# Patient Record
Sex: Female | Born: 1965 | Race: White | Hispanic: No | Marital: Single | Smoking: Never smoker
Health system: Southern US, Community
[De-identification: ages and names within clinical notes are randomized; demographics above are authoritative.]

## PROBLEM LIST (undated history)

## (undated) DIAGNOSIS — R011 Cardiac murmur, unspecified: Secondary | ICD-10-CM

## (undated) DIAGNOSIS — Z803 Family history of malignant neoplasm of breast: Secondary | ICD-10-CM

## (undated) DIAGNOSIS — N924 Excessive bleeding in the premenopausal period: Secondary | ICD-10-CM

## (undated) DIAGNOSIS — Z800 Family history of malignant neoplasm of digestive organs: Secondary | ICD-10-CM

## (undated) DIAGNOSIS — D649 Anemia, unspecified: Secondary | ICD-10-CM

## (undated) DIAGNOSIS — R6250 Unspecified lack of expected normal physiological development in childhood: Secondary | ICD-10-CM

## (undated) DIAGNOSIS — K759 Inflammatory liver disease, unspecified: Secondary | ICD-10-CM

## (undated) DIAGNOSIS — E119 Type 2 diabetes mellitus without complications: Secondary | ICD-10-CM

## (undated) DIAGNOSIS — R569 Unspecified convulsions: Secondary | ICD-10-CM

## (undated) HISTORY — DX: Family history of malignant neoplasm of digestive organs: Z80.0

## (undated) HISTORY — PX: EYE SURGERY: SHX253

## (undated) HISTORY — PX: CHOLECYSTECTOMY: SHX55

## (undated) HISTORY — DX: Family history of malignant neoplasm of breast: Z80.3

---

## 2018-08-15 ENCOUNTER — Other Ambulatory Visit: Payer: Self-pay | Admitting: Obstetrics and Gynecology

## 2018-08-19 ENCOUNTER — Ambulatory Visit (HOSPITAL_COMMUNITY)
Admission: RE | Admit: 2018-08-19 | Discharge: 2018-08-19 | Disposition: A | Discharge status: Home / Self Care | Payer: Medicare Other | Source: Ambulatory Visit | Attending: Obstetrics and Gynecology | Admitting: Obstetrics and Gynecology

## 2018-08-19 DIAGNOSIS — D509 Iron deficiency anemia, unspecified: Secondary | ICD-10-CM | POA: Insufficient documentation

## 2018-08-19 MED ORDER — SODIUM CHLORIDE 0.9 % IV SOLN
INTRAVENOUS | Status: DC | PRN
  Administered 2018-08-19: 250 mL via INTRAVENOUS

## 2018-08-19 MED ORDER — SODIUM CHLORIDE 0.9 % IV SOLN
510.0000 mg | Freq: Once | INTRAVENOUS | Status: AC
  Administered 2018-08-19: 510 mg via INTRAVENOUS
  Filled 2018-08-19: qty 17

## 2018-08-19 NOTE — Progress Notes (Signed)
Patient received Feraheme  via PIV as ordered. Observed for at least 30 minutes post infusion.Tolerated well, vitals stable, discharge instructions given, verbalized understanding. Patient alert, oriented and ambulatory at the time of discharge.  

## 2018-08-19 NOTE — Discharge Instructions (Signed)

## 2018-08-26 ENCOUNTER — Ambulatory Visit (HOSPITAL_COMMUNITY)
Admission: RE | Admit: 2018-08-26 | Discharge: 2018-08-26 | Disposition: A | Discharge status: Home / Self Care | Payer: Medicare Other | Source: Ambulatory Visit | Attending: Obstetrics and Gynecology | Admitting: Obstetrics and Gynecology

## 2018-08-26 DIAGNOSIS — D509 Iron deficiency anemia, unspecified: Secondary | ICD-10-CM | POA: Diagnosis not present

## 2018-08-26 MED ORDER — SODIUM CHLORIDE 0.9 % IV SOLN
510.0000 mg | Freq: Once | INTRAVENOUS | Status: AC
  Administered 2018-08-26: 510 mg via INTRAVENOUS
  Filled 2018-08-26: qty 17

## 2018-08-26 MED ORDER — SODIUM CHLORIDE 0.9 % IV SOLN
INTRAVENOUS | Status: DC | PRN
  Administered 2018-08-26: 250 mL via INTRAVENOUS

## 2018-08-26 NOTE — Discharge Instructions (Signed)

## 2018-08-26 NOTE — Progress Notes (Signed)
PATIENT CARE CENTER NOTE  Diagnosis: Iron Deficiency Anemia   Provider: Dr. Garwin Brothers    Procedure: IV Feraheme    Note: Patient received Feraheme infusion. Tolerated well with no adverse reaction. Observed patient for 30 minutes post-infusion. Vital signs stable. Alert, oriented and ambulatory at discharge.

## 2018-09-04 ENCOUNTER — Other Ambulatory Visit: Payer: Self-pay | Admitting: Obstetrics and Gynecology

## 2018-09-13 ENCOUNTER — Other Ambulatory Visit: Payer: Self-pay

## 2018-09-13 ENCOUNTER — Encounter (HOSPITAL_BASED_OUTPATIENT_CLINIC_OR_DEPARTMENT_OTHER): Payer: Self-pay | Admitting: *Deleted

## 2018-09-13 NOTE — Progress Notes (Addendum)
Spoke with patient sister Mariah Mcclure per patient request Npo after midnight, arrive 935 am 09-16-2018 [facility] meds to take sip of water: carbamazepine, rosuvastatin Driver sister Mariah Mcclure cell [phone] Patient developmentally delayed signs own consents per sister Mariah Mcclure Has surgery orders in epic Needs cbc and urine poct and ekg

## 2018-09-16 ENCOUNTER — Encounter (HOSPITAL_BASED_OUTPATIENT_CLINIC_OR_DEPARTMENT_OTHER)
Admission: RE | Disposition: A | Discharge status: Home / Self Care | Payer: Self-pay | Source: Home / Self Care | Attending: Obstetrics and Gynecology

## 2018-09-16 ENCOUNTER — Ambulatory Visit (HOSPITAL_BASED_OUTPATIENT_CLINIC_OR_DEPARTMENT_OTHER): Payer: Medicare Other | Admitting: Anesthesiology

## 2018-09-16 ENCOUNTER — Ambulatory Visit (HOSPITAL_BASED_OUTPATIENT_CLINIC_OR_DEPARTMENT_OTHER)
Admission: RE | Admit: 2018-09-16 | Discharge: 2018-09-16 | Disposition: A | Discharge status: Home / Self Care | Payer: Medicare Other | Attending: Obstetrics and Gynecology | Admitting: Obstetrics and Gynecology

## 2018-09-16 ENCOUNTER — Encounter (HOSPITAL_BASED_OUTPATIENT_CLINIC_OR_DEPARTMENT_OTHER): Payer: Self-pay | Admitting: Anesthesiology

## 2018-09-16 DIAGNOSIS — Z7984 Long term (current) use of oral hypoglycemic drugs: Secondary | ICD-10-CM | POA: Insufficient documentation

## 2018-09-16 DIAGNOSIS — E785 Hyperlipidemia, unspecified: Secondary | ICD-10-CM | POA: Diagnosis not present

## 2018-09-16 DIAGNOSIS — C541 Malignant neoplasm of endometrium: Secondary | ICD-10-CM | POA: Diagnosis not present

## 2018-09-16 DIAGNOSIS — R6250 Unspecified lack of expected normal physiological development in childhood: Secondary | ICD-10-CM | POA: Insufficient documentation

## 2018-09-16 DIAGNOSIS — R569 Unspecified convulsions: Secondary | ICD-10-CM | POA: Diagnosis not present

## 2018-09-16 DIAGNOSIS — Z79899 Other long term (current) drug therapy: Secondary | ICD-10-CM | POA: Diagnosis not present

## 2018-09-16 DIAGNOSIS — N924 Excessive bleeding in the premenopausal period: Secondary | ICD-10-CM | POA: Insufficient documentation

## 2018-09-16 DIAGNOSIS — E119 Type 2 diabetes mellitus without complications: Secondary | ICD-10-CM | POA: Insufficient documentation

## 2018-09-16 HISTORY — DX: Type 2 diabetes mellitus without complications: E11.9

## 2018-09-16 HISTORY — DX: Inflammatory liver disease, unspecified: K75.9

## 2018-09-16 HISTORY — DX: Unspecified lack of expected normal physiological development in childhood: R62.50

## 2018-09-16 HISTORY — DX: Excessive bleeding in the premenopausal period: N92.4

## 2018-09-16 HISTORY — DX: Unspecified convulsions: R56.9

## 2018-09-16 HISTORY — DX: Anemia, unspecified: D64.9

## 2018-09-16 HISTORY — DX: Cardiac murmur, unspecified: R01.1

## 2018-09-16 HISTORY — PX: DILATATION & CURETTAGE/HYSTEROSCOPY WITH MYOSURE: SHX6511

## 2018-09-16 LAB — CBC
HCT: 35.2 % — ABNORMAL LOW (ref 36.0–46.0)
HEMOGLOBIN: 11.1 g/dL — AB (ref 12.0–15.0)
MCH: 29.9 pg (ref 26.0–34.0)
MCHC: 31.5 g/dL (ref 30.0–36.0)
MCV: 94.9 fL (ref 80.0–100.0)
Platelets: 268 10*3/uL (ref 150–400)
RBC: 3.71 MIL/uL — ABNORMAL LOW (ref 3.87–5.11)
RDW: 15.1 % (ref 11.5–15.5)
WBC: 5.9 10*3/uL (ref 4.0–10.5)
nRBC: 0 % (ref 0.0–0.2)

## 2018-09-16 LAB — GLUCOSE, CAPILLARY
GLUCOSE-CAPILLARY: 155 mg/dL — AB (ref 70–99)
Glucose-Capillary: 141 mg/dL — ABNORMAL HIGH (ref 70–99)

## 2018-09-16 SURGERY — DILATATION & CURETTAGE/HYSTEROSCOPY WITH MYOSURE
Anesthesia: General

## 2018-09-16 MED ORDER — FENTANYL CITRATE (PF) 100 MCG/2ML IJ SOLN
25.0000 ug | INTRAMUSCULAR | Status: DC | PRN
  Filled 2018-09-16: qty 1

## 2018-09-16 MED ORDER — FENTANYL CITRATE (PF) 100 MCG/2ML IJ SOLN
INTRAMUSCULAR | Status: AC
  Filled 2018-09-16: qty 2

## 2018-09-16 MED ORDER — KETOROLAC TROMETHAMINE 30 MG/ML IJ SOLN
INTRAMUSCULAR | Status: AC
  Filled 2018-09-16: qty 1

## 2018-09-16 MED ORDER — LACTATED RINGERS IV SOLN
INTRAVENOUS | Status: DC
  Administered 2018-09-16 (×2): via INTRAVENOUS
  Filled 2018-09-16: qty 1000

## 2018-09-16 MED ORDER — PROPOFOL 10 MG/ML IV BOLUS
INTRAVENOUS | Status: DC | PRN
  Administered 2018-09-16: 150 mg via INTRAVENOUS

## 2018-09-16 MED ORDER — MIDAZOLAM HCL 5 MG/5ML IJ SOLN
INTRAMUSCULAR | Status: DC | PRN
  Administered 2018-09-16: 2 mg via INTRAVENOUS

## 2018-09-16 MED ORDER — SODIUM CHLORIDE 0.9 % IR SOLN
Status: DC | PRN
  Administered 2018-09-16: 3000 mL

## 2018-09-16 MED ORDER — DEXAMETHASONE SODIUM PHOSPHATE 10 MG/ML IJ SOLN
INTRAMUSCULAR | Status: DC | PRN
  Administered 2018-09-16: 5 mg via INTRAVENOUS

## 2018-09-16 MED ORDER — ONDANSETRON HCL 4 MG/2ML IJ SOLN
INTRAMUSCULAR | Status: DC | PRN
  Administered 2018-09-16: 4 mg via INTRAVENOUS

## 2018-09-16 MED ORDER — LIDOCAINE 2% (20 MG/ML) 5 ML SYRINGE
INTRAMUSCULAR | Status: DC | PRN
  Administered 2018-09-16: 60 mg via INTRAVENOUS

## 2018-09-16 MED ORDER — OXYCODONE HCL 5 MG PO TABS
5.0000 mg | ORAL_TABLET | Freq: Once | ORAL | Status: DC | PRN
  Filled 2018-09-16: qty 1

## 2018-09-16 MED ORDER — KETOROLAC TROMETHAMINE 30 MG/ML IJ SOLN
INTRAMUSCULAR | Status: DC | PRN
  Administered 2018-09-16: 30 mg via INTRAVENOUS

## 2018-09-16 MED ORDER — MIDAZOLAM HCL 2 MG/2ML IJ SOLN
INTRAMUSCULAR | Status: AC
  Filled 2018-09-16: qty 2

## 2018-09-16 MED ORDER — ONDANSETRON HCL 4 MG/2ML IJ SOLN
4.0000 mg | Freq: Once | INTRAMUSCULAR | Status: DC | PRN
  Filled 2018-09-16: qty 2

## 2018-09-16 MED ORDER — FENTANYL CITRATE (PF) 100 MCG/2ML IJ SOLN
INTRAMUSCULAR | Status: DC | PRN
  Administered 2018-09-16: 50 ug via INTRAVENOUS

## 2018-09-16 MED ORDER — MEPERIDINE HCL 25 MG/ML IJ SOLN
6.2500 mg | INTRAMUSCULAR | Status: DC | PRN
  Filled 2018-09-16: qty 1

## 2018-09-16 MED ORDER — IBUPROFEN 800 MG PO TABS: 800.0000 mg | ORAL_TABLET | Freq: Three times a day (TID) | ORAL | 1 refills | Status: AC | PRN

## 2018-09-16 MED ORDER — OXYCODONE HCL 5 MG/5ML PO SOLN
5.0000 mg | Freq: Once | ORAL | Status: DC | PRN
  Filled 2018-09-16: qty 5

## 2018-09-16 SURGICAL SUPPLY — 26 items
BIPOLAR CUTTING LOOP 21FR (ELECTRODE)
CANISTER SUCT 3000ML PPV (MISCELLANEOUS) ×2 IMPLANT
CATH ROBINSON RED A/P 16FR (CATHETERS) ×2 IMPLANT
COVER BACK TABLE 60X90IN (DRAPES) ×2 IMPLANT
COVER WAND RF STERILE (DRAPES) ×2 IMPLANT
DEVICE MYOSURE LITE (MISCELLANEOUS) IMPLANT
DEVICE MYOSURE REACH (MISCELLANEOUS) ×2 IMPLANT
DILATOR CANAL MILEX (MISCELLANEOUS) IMPLANT
DRAPE SHEET LG 3/4 BI-LAMINATE (DRAPES) ×2 IMPLANT
GAUZE 4X4 16PLY RFD (DISPOSABLE) ×2 IMPLANT
GLOVE BIOGEL PI IND STRL 7.0 (GLOVE) ×1 IMPLANT
GLOVE BIOGEL PI INDICATOR 7.0 (GLOVE) ×1
GLOVE ECLIPSE 6.5 STRL STRAW (GLOVE) ×2 IMPLANT
GOWN STRL REUS W/TWL LRG LVL3 (GOWN DISPOSABLE) ×2 IMPLANT
IV NS IRRIG 3000ML ARTHROMATIC (IV SOLUTION) ×2 IMPLANT
KIT PROCEDURE FLUENT (KITS) ×2 IMPLANT
KIT TURNOVER CYSTO (KITS) ×2 IMPLANT
LOOP CUTTING BIPOLAR 21FR (ELECTRODE) IMPLANT
MYOSURE XL FIBROID (MISCELLANEOUS)
PACK VAGINAL MINOR WOMEN LF (CUSTOM PROCEDURE TRAY) ×2 IMPLANT
PAD OB MATERNITY 4.3X12.25 (PERSONAL CARE ITEMS) ×2 IMPLANT
PAD PREP 24X48 CUFFED NSTRL (MISCELLANEOUS) ×2 IMPLANT
SEAL ROD LENS SCOPE MYOSURE (ABLATOR) ×2 IMPLANT
SYSTEM TISS REMOVAL MYOSURE XL (MISCELLANEOUS) IMPLANT
TOWEL OR 17X26 10 PK STRL BLUE (TOWEL DISPOSABLE) ×2 IMPLANT
WATER STERILE IRR 500ML POUR (IV SOLUTION) ×2 IMPLANT

## 2018-09-16 NOTE — Brief Op Note (Signed)
09/16/2018  12:37 PM  PATIENT:  Mariah Mcclure  53 y.o. female  PRE-OPERATIVE DIAGNOSIS:   Abnormal Perimenopausal Bleeding, Endometrial Mass  POST-OPERATIVE DIAGNOSIS:  Abnormal Perimenopausal Bleeding, Endometrial Mass  PROCEDURE:  Diagnostic hysteroscopy, hysteroscopic resection of endometrial thickening and polyp, dilation and curettage  SURGEON:  Surgeon(s) and Role:    Servando Salina, MD - Primary  PHYSICIAN ASSISTANT:   ASSISTANTS: none   ANESTHESIA:   general Findings; diffuse endometrial thickening with LUS post polyp with abnl vessels, tubal ostia seen after thickening removed. Right anterior fundal trabecular appearance. Nl endocervical canal EBL:  20 mL   BLOOD ADMINISTERED:none  DRAINS: none   LOCAL MEDICATIONS USED:  NONE  SPECIMEN:  Source of Specimen:  emc with polyp  DISPOSITION OF SPECIMEN:  PATHOLOGY  COUNTS:  YES  TOURNIQUET:  * No tourniquets in log *  DICTATION: .Other Dictation: Dictation Number [id number]  PLAN OF CARE: Discharge to home after PACU  PATIENT DISPOSITION:  PACU - hemodynamically stable.   Delay start of Pharmacological VTE agent (>24hrs) due to surgical blood loss or risk of bleeding: no

## 2018-09-16 NOTE — Transfer of Care (Signed)
Immediate Anesthesia Transfer of Care Note  Patient: Mariah Mcclure  Procedure(s) Performed: DILATATION & CURETTAGE/HYSTEROSCOPY WITH MYOSURE (N/A )  Patient Location: PACU  Anesthesia Type:General  Level of Consciousness: drowsy  Airway & Oxygen Therapy: Patient Spontanous Breathing and Patient connected to nasal cannula oxygen  Post-op Assessment: Report given to RN  Post vital signs: Reviewed and stable  Last Vitals:  Vitals Value Taken Time  BP 130/66 09/16/2018 12:42 PM  Temp    Pulse 73 09/16/2018 12:43 PM  Resp 23 09/16/2018 12:43 PM  SpO2 100 % 09/16/2018 12:43 PM  Vitals shown include unvalidated device data.  Last Pain:  Vitals:   09/16/18 0933  TempSrc: Oral         Complications: No apparent anesthesia complications

## 2018-09-16 NOTE — Anesthesia Preprocedure Evaluation (Addendum)
Anesthesia Evaluation  Patient identified by MRN, date of birth, ID band Patient awake    Reviewed: Allergy & Precautions, NPO status , Patient's Chart, lab work & pertinent test results  Airway Mallampati: I  TM Distance: >3 FB Neck ROM: Full    Dental no notable dental hx. (+) Teeth Intact   Pulmonary neg pulmonary ROS,    Pulmonary exam normal breath sounds clear to auscultation       Cardiovascular negative cardio ROS Normal cardiovascular exam+ Valvular Problems/Murmurs  Rhythm:Regular Rate:Normal     Neuro/Psych Seizures -, Well Controlled,  Developmental delayLast Sz 30 years ago    GI/Hepatic negative GI ROS, (+) Hepatitis -, CHx/o Hepatitis C - has been treated   Endo/Other  diabetes, Well Controlled, Type 2, Oral Hypoglycemic AgentsHyperlipidemia  Renal/GU      Musculoskeletal   Abdominal   Peds  Hematology  (+) anemia ,   Anesthesia Other Findings   Reproductive/Obstetrics Menometorrhagia AUB Endometrial mass                            Anesthesia Physical Anesthesia Plan  ASA: II  Anesthesia Plan: General   Post-op Pain Management:    Induction: Intravenous  PONV Risk Score and Plan: 4 or greater and Scopolamine patch - Pre-op, Midazolam, Ondansetron, Dexamethasone and Treatment may vary due to age or medical condition  Airway Management Planned: LMA  Additional Equipment:   Intra-op Plan:   Post-operative Plan: Extubation in OR  Informed Consent: I have reviewed the patients History and Physical, chart, labs and discussed the procedure including the risks, benefits and alternatives for the proposed anesthesia with the patient or authorized representative who has indicated his/her understanding and acceptance.     Dental advisory given  Plan Discussed with: CRNA and Surgeon  Anesthesia Plan Comments:        Anesthesia Quick Evaluation

## 2018-09-16 NOTE — Discharge Instructions (Signed)
CALL  IF TEMP>100.4, NOTHING PER VAGINA X 2 WK, CALL IF SOAKING A MAXI  PAD EVERY HOUR OR MORE FREQUENTLY  DISCHARGE INSTRUCTIONS: D&C The following instructions have been prepared to help you care for yourself upon your return home.   Personal hygiene:  Use sanitary pads for vaginal drainage, not tampons.  Shower the day after your procedure.  NO tub baths, pools or Jacuzzis for 2-3 weeks.  Wipe front to back after using the bathroom.  Activity and limitations:  Do NOT drive or operate any equipment for 24 hours. The effects of anesthesia are still present and drowsiness may result.  Do NOT rest in bed all day.  Walking is encouraged.  Walk up and down stairs slowly.  You may resume your normal activity in one to two days or as indicated by your physician.  Sexual activity: NO intercourse for at least 2 weeks after the procedure, or as indicated by your physician.  Diet: Eat a light meal as desired this evening. You may resume your usual diet tomorrow.  Return to work: You may resume your work activities in one to two days or as indicated by your doctor.  What to expect after your surgery: Expect to have vaginal bleeding/discharge for 2-3 days and spotting for up to 10 days. It is not unusual to have soreness for up to 1-2 weeks. You may have a slight burning sensation when you urinate for the first day. Mild cramps may continue for a couple of days. You may have a regular period in 2-6 weeks.  Call your doctor for any of the following:  Excessive vaginal bleeding, saturating and changing one pad every hour.  Inability to urinate 6 hours after discharge from hospital.  Pain not relieved by pain medication.  Fever of 100.4 F or greater.  Unusual vaginal discharge or odor.    Post Anesthesia Home Care Instructions  Activity: Get plenty of rest for the remainder of the day. A responsible individual must stay with you for 24 hours following the procedure.  For the  next 24 hours, DO NOT: -Drive a car -Paediatric nurse -Drink alcoholic beverages -Take any medication unless instructed by your physician -Make any legal decisions or sign important papers.  Meals: Start with liquid foods such as gelatin or soup. Progress to regular foods as tolerated. Avoid greasy, spicy, heavy foods. If nausea and/or vomiting occur, drink only clear liquids until the nausea and/or vomiting subsides. Call your physician if vomiting continues.  Special Instructions/Symptoms: Your throat may feel dry or sore from the anesthesia or the breathing tube placed in your throat during surgery. If this causes discomfort, gargle with warm salt water. The discomfort should disappear within 24 hours.

## 2018-09-16 NOTE — H&P (Addendum)
Mariah Mcclure is an 53 y.o. female G0 SF presents for dx hysteroscopy, D&C, resection of endometrial mass due to AUB( heavy irregular perimenopausal bleeding). sono hysterogram  Showed a 3.0 cm fundal endometrial mass  Pertinent Gynecological History: Menses: heavy irregular Bleeding: dysfunctional uterine bleeding Contraception: none DES exposure: denies Blood transfusions: none Sexually transmitted diseases: no past history Previous GYN Procedures: none  Last mammogram: normal Date: 2019 Last pap: normal Date:2019 OB History: G0   Menstrual History: Menarche age: n/a Patient's last menstrual period was 09/09/2018.    Past Medical History:  Diagnosis Date  . Abnormal perimenopausal bleeding   . Anemia    due to bleeding  . Developmental delay   . Diabetes mellitus without complication ([place])    type 2  . Heart murmur   . Hepatitis    hep c yrs ago took tx  . Seizure ([place]) 30 yrs ago    Past Surgical History:  Procedure Laterality Date  . CHOLECYSTECTOMY  20 yrs ago    History reviewed. No pertinent family history.  Social History:  reports that she has never smoked. She has never used smokeless tobacco. She reports that she does not drink alcohol or use drugs.  Allergies: No Known Allergies  No medications prior to admission.    Review of Systems  All other systems reviewed and are negative.   Height 5' (1.524 m), weight 59.9 kg, last menstrual period 09/09/2018. Physical Exam  Constitutional: She is oriented to person, place, and time. She appears well-developed and well-nourished.  HENT:  Head: Atraumatic.  Eyes: EOM are normal.  Neck: Neck supple.  Cardiovascular: Regular rhythm.  Genitourinary:    Genitourinary Comments: Vulva nl  Vagina nl Cervix closed Uterus AV Adnexal no palp   Musculoskeletal: Normal range of motion.  Neurological: She is alert and oriented to person, place, and time.  Skin: Skin is warm.    No results found for this or  any previous visit (from the past 24 hour(s)).  No results found.  Assessment/Plan: Abnormal perimenopausal bleeding Endometrial mass P) dx hysteroscopy, D&C, resection of endometrial mass. Risk of surgery includes infection, bleeding, uterine perforation ( 07/998) and its risk,  Injury to surrounding organ structures, thermal injury, fluid overload and its risk/mgmt. All ? answered  Ponce Skillman A Veer Elamin 09/16/2018, 5:16 AM   Addendum. No change since last seen

## 2018-09-16 NOTE — Op Note (Signed)
NAMEVAIL, BASISTA MEDICAL RECORD [id number] ACCOUNT [id number] DATE OF BIRTH:Mar 23, 1966 FACILITY: [facility] LOCATION: [facility] PHYSICIAN:Jeancarlos Marchena A. Nazire Fruth, MD  OPERATIVE REPORT  DATE OF PROCEDURE:  09/16/2018  PREOPERATIVE DIAGNOSES:  Abnormal perimenopausal bleeding, endometrial mass.  PROCEDURE PERFORMED:  Diagnostic hysteroscopy, hysteroscopic resection of endometrial mass and thickening, dilation and curettage.  POSTOPERATIVE DIAGNOSES:  Abnormal perimenopausal bleeding, endometrial mass, endometrial thickening.  ANESTHESIA:  General.  SURGEON:  Servando Salina, MD  ASSISTANT:  None.  DESCRIPTION OF PROCEDURE:  Under adequate general anesthesia, the patient was placed in the dorsal lithotomy position.  She was sterilely prepped and draped in the usual fashion.  The bladder was catheterized, a moderate amount of urine.  Examination under anesthesia revealed an anteverted uterus.  No adnexal masses could be appreciated.  A bivalve speculum was placed in the vagina.  A single-tooth tenaculum was placed on the anterior lip of the cervix.  Atrophic vagina was noted.  Initial attempt at  the cervical dilation was limited, and therefore the bivalve speculum was removed.  A weighted speculum was placed which easily facilitated the cervical os being able to be dilated up to a #19 Pratt dilator.  A diagnostic hysteroscope was introduced  into the uterine cavity.  Diffuse thickening frond-like material was noted throughout with the lower uterine segment endometrial polyp with some irregular blood vessels noted.  Using the Reach resectoscope, the entire cavity was resected, at which time  both tubal ostia could then be seen.  There were some trabecular findings on the right fundal area.  The endocervical canal was without any lesions.  When all endometrial thickening and polypoid lesion was removed, all instruments were then removed from the vagina.  Some bleeding was noted afterwards  which was found in the uterine cavity, not the tenaculum site.  With bivalve massage and emptying of the bladder, the bleeding subsequently abated.  SPECIMEN:  Endometrial curettings with polyp sent to pathology.  ESTIMATED BLOOD LOSS:  About 20 mL.  INTRAOPERATIVE FLUIDS:  1 L.  FLUID DEFICIT:  420 mL.  COMPLICATIONS:  None.  DISPOSITION:  The patient tolerated the procedure well and was transferred to recovery room in stable condition.  LN/NUANCE  D:09/16/2018 T:09/16/2018 JOB:[id number]

## 2018-09-16 NOTE — Anesthesia Procedure Notes (Signed)
Procedure Name: LMA Insertion Date/Time: 09/16/2018 11:52 AM Performed by: Bonney Aid, CRNA Pre-anesthesia Checklist: Patient identified, Emergency Drugs available, Suction available and Patient being monitored Patient Re-evaluated:Patient Re-evaluated prior to induction Oxygen Delivery Method: Circle system utilized Preoxygenation: Pre-oxygenation with 100% oxygen Induction Type: IV induction Ventilation: Mask ventilation without difficulty LMA: LMA inserted LMA Size: 4.0 Number of attempts: 1 Airway Equipment and Method: Bite block Placement Confirmation: positive ETCO2 Tube secured with: Tape Dental Injury: Teeth and Oropharynx as per pre-operative assessment

## 2018-09-16 NOTE — Anesthesia Postprocedure Evaluation (Signed)
Anesthesia Post Note  Patient: [name]  Procedure(s) Performed: DILATATION & CURETTAGE/HYSTEROSCOPY WITH MYOSURE (N/A )     Patient location during evaluation: PACU Anesthesia Type: General Level of consciousness: awake and alert and oriented Pain management: pain level controlled Vital Signs Assessment: post-procedure vital signs reviewed and stable Respiratory status: spontaneous breathing, nonlabored ventilation and respiratory function stable Cardiovascular status: stable and blood pressure returned to baseline Postop Assessment: no apparent nausea or vomiting Anesthetic complications: no    Last Vitals:  Vitals:   09/16/18 1245 09/16/18 1300  BP: 130/66 (!) 160/74  Pulse: 68 79  Resp: 10 10  Temp:    SpO2: 100% 100%    Last Pain:  Vitals:   09/16/18 1300  TempSrc:   PainSc: 1                  Zakye Baby A.

## 2018-09-17 ENCOUNTER — Encounter (HOSPITAL_BASED_OUTPATIENT_CLINIC_OR_DEPARTMENT_OTHER): Payer: Self-pay | Admitting: Obstetrics and Gynecology

## 2018-09-23 ENCOUNTER — Other Ambulatory Visit: Payer: Self-pay | Admitting: Gynecologic Oncology

## 2018-09-23 ENCOUNTER — Encounter: Payer: Self-pay | Admitting: Gynecologic Oncology

## 2018-09-23 ENCOUNTER — Inpatient Hospital Stay: Payer: Medicare Other | Attending: Gynecologic Oncology | Admitting: Gynecologic Oncology

## 2018-09-23 VITALS — BP 152/95 | HR 89 | Temp 98.2°F | Resp 20 | Ht <= 58 in | Wt 138.7 lb

## 2018-09-23 DIAGNOSIS — Z803 Family history of malignant neoplasm of breast: Secondary | ICD-10-CM

## 2018-09-23 DIAGNOSIS — Z8049 Family history of malignant neoplasm of other genital organs: Secondary | ICD-10-CM | POA: Diagnosis not present

## 2018-09-23 DIAGNOSIS — Z800 Family history of malignant neoplasm of digestive organs: Secondary | ICD-10-CM | POA: Diagnosis not present

## 2018-09-23 DIAGNOSIS — C541 Malignant neoplasm of endometrium: Secondary | ICD-10-CM | POA: Insufficient documentation

## 2018-09-23 DIAGNOSIS — D4959 Neoplasm of unspecified behavior of other genitourinary organ: Secondary | ICD-10-CM

## 2018-09-23 DIAGNOSIS — Z801 Family history of malignant neoplasm of trachea, bronchus and lung: Secondary | ICD-10-CM | POA: Insufficient documentation

## 2018-09-23 MED ORDER — OXYCODONE HCL 5 MG PO TABS: 5.0000 mg | ORAL_TABLET | ORAL | 0 refills | Status: DC | PRN

## 2018-09-23 MED ORDER — SENNOSIDES-DOCUSATE SODIUM 8.6-50 MG PO TABS: 2.0000 | ORAL_TABLET | Freq: Every day | ORAL | 1 refills | Status: DC

## 2018-09-23 NOTE — H&P (View-Only) (Signed)
Consult Note: Gyn-Onc  Consult was requested by Dr. Garwin Brothers for the evaluation of Mariah Mcclure 53 y.o. female  CC:  Chief Complaint  Patient presents with  . Endometrial cancer [facility])    Assessment/Plan:  [name]  is a 53 y.o.  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   A detailed discussion was held with the patient and her family with regard to to her endometrial cancer diagnosis. We discussed the standard management options for uterine cancer which includes surgery followed possibly by adjuvant therapy depending on the results of surgery. The options for surgical management include a hysterectomy and removal of the tubes and ovaries possibly with removal of pelvic and para-aortic lymph nodes.If feasible, a minimally invasive approach including a robotic hysterectomy or laparoscopic hysterectomy have benefits including shorter hospital stay, recovery time and better wound healing than with open surgery. The patient has been counseled about these surgical options and the risks of surgery in general including infection, bleeding, damage to surrounding structures (including bowel, bladder, ureters, nerves or vessels), and the postoperative risks of PE/ DVT, and lymphedema. I extensively reviewed the additional risks of robotic hysterectomy including possible need for conversion to open laparotomy.  I discussed positioning during surgery of trendelenberg and risks of minor facial swelling and care we take in preoperative positioning.  After counseling and consideration of her options, she desires to proceed with robotic assisted total hysterectomy with bilateral sapingo-oophorectomy and SLN biopsy.   She will have a preoperative CT scan given her high risk endometrial cancer to evaluate for metastatic disease.   She will be seen by anesthesia for preoperative clearance and discussion of postoperative pain management.  She was given the opportunity to ask  questions, which were answered to her satisfaction, and she is agreement with the above mentioned plan of care.  Due to her significant family history we will schedule her to see genetics postoperatively.    HPI: [name] is a 53 year old P0 who is seen in consultation at the request of Dr. cousins for a grade 3 endometrial cancer.  The patient has possibly transition through menopause approximately 2 years ago, although is unclear if she is postmenopausal or not as she is always had a history of abnormal uterine bleeding.  The patient has MR and developmental delay delay and her primary caregivers are her sister in [place] and her brother in [place].  Her sister reports that she developed left lower quadrant pain around Thanksgiving of 2019.  In December 2019 she began reporting heavy vaginal bleeding which became so persistent in January 2020 that she became anemic and required 2 unit packed cell blood transfusion.  She was then taken to see Dr. cousins for further evaluation and Dr. cousins performed a transvaginal ultrasound scan on August 20, 2018 which revealed a uterus measuring 8.5 x 4.8 x 5.3 cm with an endometrial thickness of 25 mm.  This was followed up by a sonohysterogram which revealed endometrial wall thickening within the fundal portion measuring 3 cm with hypervascularity.  She then underwent a D&C in the operating room on September 16, 2018 this was a diagnostic hysteroscopy, hysteroscopic resection of the endometrial mass and D&C.  The procedure was uncomplicated.  The final pathology revealed a high-grade endometrial carcinoma, with a differential diagnosis including high-grade endometrioid and a mixed endometrioid serous carcinoma.  P53 stain was normal.  P 16 did not show significant overexpression.  The patient has some intellectual impairment from  developmental delay.  She is has a seizure disorder though this included on a singular grand mal seizure approximately 30  years ago.  She takes Tegretol for this.  She has type 2 diabetes mellitus which is controlled with metformin.  Her only prior abdominal surgery was a laparoscopic cholecystectomy.  Her family history is remarkable for a maternal aunt who has a history of endometrial cancer a maternal aunt with breast cancer maternal aunt with pancreatic cancer and a maternal uncle with lung cancer who was a non-smoker but possibly had environmental exposure to coal.  Her father had a diagnosis of prostate cancer.  Current Meds:  Outpatient Encounter Medications as of 09/23/2018  Medication Sig  . carbamazepine (TEGRETOL XR) 400 MG 12 hr tablet Take 400 mg by mouth 2 (two) times daily. Seizure 30 yrs ago  . ibuprofen (ADVIL,MOTRIN) 800 MG tablet Take 1 tablet (800 mg total) by mouth every 8 (eight) hours as needed for moderate pain.  . metFORMIN (GLUCOPHAGE) 500 MG tablet Take by mouth 2 (two) times daily with a meal.  . rosuvastatin (CRESTOR) 20 MG tablet Take 20 mg by mouth daily.   No facility-administered encounter medications on file as of 09/23/2018.     Allergy: No Known Allergies  Social Hx:   Social History   Socioeconomic History  . Marital status: Single    Spouse name: Not on file  . Number of children: Not on file  . Years of education: Not on file  . Highest education level: Not on file  Occupational History  . Not on file  Social Needs  . Financial resource strain: Not on file  . Food insecurity:    Worry: Not on file    Inability: Not on file  . Transportation needs:    Medical: Not on file    Non-medical: Not on file  Tobacco Use  . Smoking status: Never Smoker  . Smokeless tobacco: Never Used  Substance and Sexual Activity  . Alcohol use: Never    Frequency: Never  . Drug use: Never  . Sexual activity: Not on file  Lifestyle  . Physical activity:    Days per week: Not on file    Minutes per session: Not on file  . Stress: Not on file  Relationships  . Social  connections:    Talks on phone: Not on file    Gets together: Not on file    Attends religious service: Not on file    Active member of club or organization: Not on file    Attends meetings of clubs or organizations: Not on file    Relationship status: Not on file  . Intimate partner violence:    Fear of current or ex partner: Not on file    Emotionally abused: Not on file    Physically abused: Not on file    Forced sexual activity: Not on file  Other Topics Concern  . Not on file  Social History Narrative  . Not on file    Past Surgical Hx:  Past Surgical History:  Procedure Laterality Date  . CHOLECYSTECTOMY  20 yrs ago  . DILATATION & CURETTAGE/HYSTEROSCOPY WITH MYOSURE N/A 09/16/2018   Procedure: DILATATION & CURETTAGE/HYSTEROSCOPY WITH MYOSURE;  Surgeon: Servando Salina, MD;  Location: [facility];  Service: Gynecology;  Laterality: N/A;  . EYE SURGERY     Patient was 53 years old.    Past Medical Hx:  Past Medical History:  Diagnosis Date  . Abnormal perimenopausal  bleeding   . Anemia    due to bleeding  . Developmental delay   . Diabetes mellitus without complication ([place])    type 2  . Heart murmur   . Hepatitis     Patient states that she has hep A yrs ago took tx  . Seizure ([place]) 30 yrs ago    Past Gynecological History:  G0 Patient's last menstrual period was 09/09/2018 (approximate).  Family Hx:  Family History  Problem Relation Age of Onset  . Stroke Mother   . Prostate cancer Father   . Breast cancer Maternal Aunt   . Lung cancer Maternal Uncle   . Endometrial cancer Maternal Aunt   . Stroke Maternal Aunt   . Pancreatic cancer Maternal Aunt     Review of Systems:  Constitutional  Feels well,    ENT Normal appearing ears and nares bilaterally Skin/Breast  No rash, sores, jaundice, itching, dryness Cardiovascular  No chest pain, shortness of breath, or edema  Pulmonary  No cough or wheeze.  Gastro Intestinal  No nausea,  vomitting, or diarrhoea. No bright red blood per rectum, no abdominal pain, change in bowel movement, or constipation.  Genito Urinary  No frequency, urgency, dysuria, +postmenopausal bleeding Musculo Skeletal  No myalgia, arthralgia, joint swelling or pain  Neurologic  No weakness, numbness, change in gait,  Psychology  No depression, anxiety, insomnia.   Vitals:  Blood pressure (!) 152/95, pulse 89, temperature 98.2 F (36.8 C), temperature source Oral, resp. rate 20, height '4\' 10"'  (1.473 m), weight 138 lb 11.2 oz (62.9 kg), last menstrual period 09/09/2018, SpO2 100 %.  Physical Exam: WD in NAD Neck  Supple NROM, without any enlargements.  Lymph Node Survey No cervical supraclavicular or inguinal adenopathy Cardiovascular  Pulse normal rate, regularity and rhythm. S1 and S2 normal.  Lungs  Clear to auscultation bilateraly, without wheezes/crackles/rhonchi. Good air movement.  Skin  No rash/lesions/breakdown  Psychiatry  Alert and oriented to person, place, and time  Abdomen  Normoactive bowel sounds, abdomen soft, non-tender and nonobese without evidence of hernia. Back No CVA tenderness Genito Urinary  Vulva/vagina: Normal external female genitalia.   No lesions. No discharge or bleeding.  Bladder/urethra:  No lesions or masses, well supported bladder  Vagina: normal, no lesions, narrow upper vagina  Cervix: Normal appearing, no lesions.  Uterus: Small, mobile, no parametrial involvement or nodularity.  Adnexa: no palpable masses. Rectal  deferred Extremities  No bilateral cyanosis, clubbing or edema.   Thereasa Solo, MD  09/23/2018, 9:44 AM

## 2018-09-23 NOTE — Patient Instructions (Signed)
Preparing for your Surgery  Plan for surgery on October 01, 2018 with Dr. [name] at [facility] will be scheduled for a robotic assisted total hysterectomy, bilateral salpingo-oophorectomy, sentinel lymph node biopsy.   Pre-operative Testing -You will receive a phone call from presurgical testing at [facility] if you have not received a call already to arrange for a pre-operative testing appointment before your surgery.  This appointment normally occurs one to two weeks before your scheduled surgery.   -Bring your insurance card, copy of an advanced directive if applicable, medication list  -At that visit, you will be asked to sign a consent for a possible blood transfusion in case a transfusion becomes necessary during surgery.  The need for a blood transfusion is rare but having consent is a necessary part of your care.     -You should not be taking blood thinners or aspirin at least ten days prior to surgery unless instructed by your surgeon.  -As part of our enhanced surgical recovery pathway, you may be advised to drink a carbohydrate drink the morning of surgery (at least 3 hours before). If you are diabetic, this will be avoided in order to prevent elevated glucose levels.  Day Before Surgery at [place] will be asked to take in a light diet the day before surgery.  Avoid carbonated beverages.  You will be advised to have nothing to eat or drink after midnight the evening before.    Eat a light diet the day before surgery.  Examples including soups, broths, toast, yogurt, mashed potatoes.  Things to avoid include carbonated beverages (fizzy beverages), raw fruits and raw vegetables, or beans.   If your bowels are filled with gas, your surgeon will have difficulty visualizing your pelvic organs which increases your surgical risks.  Your role in recovery Your role is to become active as soon as directed by your doctor, while still giving yourself time to heal.   Rest when you feel tired. You will be asked to do the following in order to speed your recovery:  - Cough and breathe deeply. This helps toclear and expand your lungs and can prevent pneumonia. You may be given a spirometer to practice deep breathing. A staff member will show you how to use the spirometer. - Do mild physical activity. Walking or moving your legs help your circulation and body functions return to normal. A staff member will help you when you try to walk and will provide you with simple exercises. Do not try to get up or walk alone the first time. - Actively manage your pain. Managing your pain lets you move in comfort. We will ask you to rate your pain on a scale of zero to 10. It is your responsibility to tell your doctor or nurse where and how much you hurt so your pain can be treated.  Special Considerations -If you are diabetic, you may be placed on insulin after surgery to have closer control over your blood sugars to promote healing and recovery.  This does not mean that you will be discharged on insulin.  If applicable, your oral antidiabetics will be resumed when you are tolerating a solid diet.  -Your final pathology results from surgery should be available around one week after surgery and the results will be relayed to you when available.  -Dr. Lahoma Crocker is the Surgeon that assists your GYN Oncologist with surgery.  If you stay the night, the next day after your surgery  you will either see Dr. Denman George or Dr. Lahoma Crocker.  -FMLA forms can be faxed to [phone] and please allow 5-7 business days for completion.  Pain Management After Surgery -You have been prescribed your pain medication and bowel regimen medications before surgery so that you can have these available when you are discharged from the hospital. The pain medication is for use ONLY AFTER surgery and a new prescription will not be given.   -Make sure that you have Tylenol and Ibuprofen at home  to use on a regular basis after surgery for pain control. We recommend alternating the medications every hour to six hours since they work differently and are processed in the body differently for pain relief.  -Review the attached handout on narcotic use and their risks and side effects.   Bowel Regimen -You have been prescribed Sennakot-S to take nightly to prevent constipation especially if you are taking the narcotic pain medication intermittently.  It is important to prevent constipation and drink adequate amounts of liquids.  Blood Transfusion Information WHAT IS A BLOOD TRANSFUSION? A transfusion is the replacement of blood or some of its parts. Blood is made up of multiple cells which provide different functions.  Red blood cells carry oxygen and are used for blood loss replacement.  White blood cells fight against infection.  Platelets control bleeding.  Plasma helps clot blood.  Other blood products are available for specialized needs, such as hemophilia or other clotting disorders. BEFORE THE TRANSFUSION  Who gives blood for transfusions?   You may be able to donate blood to be used at a later date on yourself (autologous donation).  Relatives can be asked to donate blood. This is generally not any safer than if you have received blood from a stranger. The same precautions are taken to ensure safety when a relative's blood is donated.  Healthy volunteers who are fully evaluated to make sure their blood is safe. This is blood bank blood. Transfusion therapy is the safest it has ever been in the practice of medicine. Before blood is taken from a donor, a complete history is taken to make sure that person has no history of diseases nor engages in risky social behavior (examples are intravenous drug use or sexual activity with multiple partners). The donor's travel history is screened to minimize risk of transmitting infections, such as malaria. The donated blood is tested for  signs of infectious diseases, such as HIV and hepatitis. The blood is then tested to be sure it is compatible with you in order to minimize the chance of a transfusion reaction. If you or a relative donates blood, this is often done in anticipation of surgery and is not appropriate for emergency situations. It takes many days to process the donated blood. RISKS AND COMPLICATIONS Although transfusion therapy is very safe and saves many lives, the main dangers of transfusion include:   Getting an infectious disease.  Developing a transfusion reaction. This is an allergic reaction to something in the blood you were given. Every precaution is taken to prevent this. The decision to have a blood transfusion has been considered carefully by your caregiver before blood is given. Blood is not given unless the benefits outweigh the risks.

## 2018-09-23 NOTE — Progress Notes (Signed)
Consult Note: Gyn-Onc  Consult was requested by Dr. Garwin Brothers for the evaluation of Mariah Mcclure 53 y.o. female  CC:  Chief Complaint  Patient presents with  . Endometrial cancer [facility])    Assessment/Plan:  [name]  is a 53 y.o.  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   A detailed discussion was held with the patient and her family with regard to to her endometrial cancer diagnosis. We discussed the standard management options for uterine cancer which includes surgery followed possibly by adjuvant therapy depending on the results of surgery. The options for surgical management include a hysterectomy and removal of the tubes and ovaries possibly with removal of pelvic and para-aortic lymph nodes.If feasible, a minimally invasive approach including a robotic hysterectomy or laparoscopic hysterectomy have benefits including shorter hospital stay, recovery time and better wound healing than with open surgery. The patient has been counseled about these surgical options and the risks of surgery in general including infection, bleeding, damage to surrounding structures (including bowel, bladder, ureters, nerves or vessels), and the postoperative risks of PE/ DVT, and lymphedema. I extensively reviewed the additional risks of robotic hysterectomy including possible need for conversion to open laparotomy.  I discussed positioning during surgery of trendelenberg and risks of minor facial swelling and care we take in preoperative positioning.  After counseling and consideration of her options, she desires to proceed with robotic assisted total hysterectomy with bilateral sapingo-oophorectomy and SLN biopsy.   She will have a preoperative CT scan given her high risk endometrial cancer to evaluate for metastatic disease.   She will be seen by anesthesia for preoperative clearance and discussion of postoperative pain management.  She was given the opportunity to ask  questions, which were answered to her satisfaction, and she is agreement with the above mentioned plan of care.  Due to her significant family history we will schedule her to see genetics postoperatively.    HPI: [name] is a 53 year old P0 who is seen in consultation at the request of Dr. cousins for a grade 3 endometrial cancer.  The patient has possibly transition through menopause approximately 2 years ago, although is unclear if she is postmenopausal or not as she is always had a history of abnormal uterine bleeding.  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  Her sister reports that she developed left lower quadrant pain around Thanksgiving of 2019.  In December 2019 she began reporting heavy vaginal bleeding which became so persistent in January 2020 that she became anemic and required 2 unit packed cell blood transfusion.  She was then taken to see Dr. cousins for further evaluation and Dr. cousins performed a transvaginal ultrasound scan on August 20, 2018 which revealed a uterus measuring 8.5 x 4.8 x 5.3 cm with an endometrial thickness of 25 mm.  This was followed up by a sonohysterogram which revealed endometrial wall thickening within the fundal portion measuring 3 cm with hypervascularity.  She then underwent a D&C in the operating room on September 16, 2018 this was a diagnostic hysteroscopy, hysteroscopic resection of the endometrial mass and D&C.  The procedure was uncomplicated.  The final pathology revealed a high-grade endometrial carcinoma, with a differential diagnosis including high-grade endometrioid and a mixed endometrioid serous carcinoma.  P53 stain was normal.  P 16 did not show significant overexpression.  The patient has some intellectual impairment from  developmental delay.  She is has a seizure disorder though this included on a singular grand mal seizure approximately 30  years ago.  She takes Tegretol for this.  She has type 2 diabetes mellitus which is controlled with metformin.  Her only prior abdominal surgery was a laparoscopic cholecystectomy.  Her family history is remarkable for a maternal aunt who has a history of endometrial cancer a maternal aunt with breast cancer maternal aunt with pancreatic cancer and a maternal uncle with lung cancer who was a non-smoker but possibly had environmental exposure to coal.  Her father had a diagnosis of prostate cancer.  Current Meds:  Outpatient Encounter Medications as of 09/23/2018  Medication Sig  . carbamazepine (TEGRETOL XR) 400 MG 12 hr tablet Take 400 mg by mouth 2 (two) times daily. Seizure 30 yrs ago  . ibuprofen (ADVIL,MOTRIN) 800 MG tablet Take 1 tablet (800 mg total) by mouth every 8 (eight) hours as needed for moderate pain.  . metFORMIN (GLUCOPHAGE) 500 MG tablet Take by mouth 2 (two) times daily with a meal.  . rosuvastatin (CRESTOR) 20 MG tablet Take 20 mg by mouth daily.   No facility-administered encounter medications on file as of 09/23/2018.     Allergy: No Known Allergies  Social Hx:   Social History   Socioeconomic History  . Marital status: Single    Spouse name: Not on file  . Number of children: Not on file  . Years of education: Not on file  . Highest education level: Not on file  Occupational History  . Not on file  Social Needs  . Financial resource strain: Not on file  . Food insecurity:    Worry: Not on file    Inability: Not on file  . Transportation needs:    Medical: Not on file    Non-medical: Not on file  Tobacco Use  . Smoking status: Never Smoker  . Smokeless tobacco: Never Used  Substance and Sexual Activity  . Alcohol use: Never    Frequency: Never  . Drug use: Never  . Sexual activity: Not on file  Lifestyle  . Physical activity:    Days per week: Not on file    Minutes per session: Not on file  . Stress: Not on file  Relationships  . Social  connections:    Talks on phone: Not on file    Gets together: Not on file    Attends religious service: Not on file    Active member of club or organization: Not on file    Attends meetings of clubs or organizations: Not on file    Relationship status: Not on file  . Intimate partner violence:    Fear of current or ex partner: Not on file    Emotionally abused: Not on file    Physically abused: Not on file    Forced sexual activity: Not on file  Other Topics Concern  . Not on file  Social History Narrative  . Not on file    Past Surgical Hx:  Past Surgical History:  Procedure Laterality Date  . CHOLECYSTECTOMY  20 yrs ago  . DILATATION & CURETTAGE/HYSTEROSCOPY WITH MYOSURE N/A 09/16/2018   Procedure: DILATATION & CURETTAGE/HYSTEROSCOPY WITH MYOSURE;  Surgeon: Servando Salina, MD;  Location: [facility];  Service: Gynecology;  Laterality: N/A;  . EYE SURGERY     Patient was 53 years old.    Past Medical Hx:  Past Medical History:  Diagnosis Date  . Abnormal perimenopausal  bleeding   . Anemia    due to bleeding  . Developmental delay   . Diabetes mellitus without complication ([place])    type 2  . Heart murmur   . Hepatitis     Patient states that she has hep A yrs ago took tx  . Seizure ([place]) 30 yrs ago    Past Gynecological History:  G0 Patient's last menstrual period was 09/09/2018 (approximate).  Family Hx:  Family History  Problem Relation Age of Onset  . Stroke Mother   . Prostate cancer Father   . Breast cancer Maternal Aunt   . Lung cancer Maternal Uncle   . Endometrial cancer Maternal Aunt   . Stroke Maternal Aunt   . Pancreatic cancer Maternal Aunt     Review of Systems:  Constitutional  Feels well,    ENT Normal appearing ears and nares bilaterally Skin/Breast  No rash, sores, jaundice, itching, dryness Cardiovascular  No chest pain, shortness of breath, or edema  Pulmonary  No cough or wheeze.  Gastro Intestinal  No nausea,  vomitting, or diarrhoea. No bright red blood per rectum, no abdominal pain, change in bowel movement, or constipation.  Genito Urinary  No frequency, urgency, dysuria, +postmenopausal bleeding Musculo Skeletal  No myalgia, arthralgia, joint swelling or pain  Neurologic  No weakness, numbness, change in gait,  Psychology  No depression, anxiety, insomnia.   Vitals:  Blood pressure (!) 152/95, pulse 89, temperature 98.2 F (36.8 C), temperature source Oral, resp. rate 20, height '4\' 10"'  (1.473 m), weight 138 lb 11.2 oz (62.9 kg), last menstrual period 09/09/2018, SpO2 100 %.  Physical Exam: WD in NAD Neck  Supple NROM, without any enlargements.  Lymph Node Survey No cervical supraclavicular or inguinal adenopathy Cardiovascular  Pulse normal rate, regularity and rhythm. S1 and S2 normal.  Lungs  Clear to auscultation bilateraly, without wheezes/crackles/rhonchi. Good air movement.  Skin  No rash/lesions/breakdown  Psychiatry  Alert and oriented to person, place, and time  Abdomen  Normoactive bowel sounds, abdomen soft, non-tender and nonobese without evidence of hernia. Back No CVA tenderness Genito Urinary  Vulva/vagina: Normal external female genitalia.   No lesions. No discharge or bleeding.  Bladder/urethra:  No lesions or masses, well supported bladder  Vagina: normal, no lesions, narrow upper vagina  Cervix: Normal appearing, no lesions.  Uterus: Small, mobile, no parametrial involvement or nodularity.  Adnexa: no palpable masses. Rectal  deferred Extremities  No bilateral cyanosis, clubbing or edema.   Thereasa Solo, MD  09/23/2018, 9:44 AM

## 2018-09-24 NOTE — Patient Instructions (Addendum)
Mariah Mcclure  09/24/2018   Your procedure is scheduled on: Tuesday 10/01/2018  Report to [facility] Main  Entrance              Report to admitting at   0530 AM    Call this number if you have problems the morning of surgery [phone]          How to Manage Your Diabetes Before and After Surgery  Why is it important to control my blood sugar before and after surgery? . Improving blood sugar levels before and after surgery helps healing and can limit problems. . A way of improving blood sugar control is eating a healthy diet by: o  Eating less sugar and carbohydrates o  Increasing activity/exercise o  Talking with your doctor about reaching your blood sugar goals . High blood sugars (greater than 180 mg/dL) can raise your risk of infections and slow your recovery, so you will need to focus on controlling your diabetes during the weeks before surgery. . Make sure that the doctor who takes care of your diabetes knows about your planned surgery including the date and location.  How do I manage my blood sugar before surgery? . Check your blood sugar at least 4 times a day, starting 2 days before surgery, to make sure that the level is not too high or low. o Check your blood sugar the morning of your surgery when you wake up and every 2 hours until you get to the Short Stay unit. . If your blood sugar is less than 70 mg/dL, you will need to treat for low blood sugar: o Do not take insulin. o Treat a low blood sugar (less than 70 mg/dL) with  cup of clear juice (cranberry or apple), 4 glucose tablets, OR glucose gel. o Recheck blood sugar in 15 minutes after treatment (to make sure it is greater than 70 mg/dL). If your blood sugar is not greater than 70 mg/dL on recheck, call [phone] for further instructions. . Report your blood sugar to the short stay nurse when you get to Short Stay.  . If you are admitted to the hospital after surgery: o Your blood sugar  will be checked by the staff and you will probably be given insulin after surgery (instead of oral diabetes medicines) to make sure you have good blood sugar levels. o The goal for blood sugar control after surgery is 80-180 mg/dL.   WHAT DO I DO ABOUT MY DIABETES MEDICATION?         The day before surgery, Take Metformin as usual.  . Do not take oral diabetes medicines (pills) the morning of surgery.       Remember: Do not eat food or drink liquids :After Midnight.              BRUSH YOUR TEETH MORNING OF SURGERY AND RINSE YOUR MOUTH OUT, NO CHEWING GUM CANDY OR MINTS.     Take these medicines the morning of surgery with A SIP OF WATER: Carbamazepine (Tegretol), Rosuvastatin (Crestor)               DO NOT TAKE ANY DIABETIC MEDICATIONS DAY OF YOUR SURGERY!                               You may not have any metal on your body  including hair pins and              piercings  Do not wear jewelry, make-up, lotions, powders or perfumes, deodorant             Do not wear nail polish.  Do not shave  48 hours prior to surgery.              Do not bring valuables to the hospital. [place].  Contacts, dentures or bridgework may not be worn into surgery.  Leave suitcase in the car. After surgery it may be brought to your room.     Patients discharged the day of surgery will not be allowed to drive home. IF YOU ARE HAVING SURGERY AND GOING HOME THE SAME DAY, YOU MUST HAVE AN ADULT TO DRIVE YOU HOME AND BE WITH YOU FOR 24 HOURS. YOU MAY GO HOME BY TAXI OR UBER OR ORTHERWISE, BUT AN ADULT MUST ACCOMPANY YOU HOME AND STAY WITH YOU FOR 24 HOURS.  Name and phone number of your driver:                Please read over the following fact sheets you were given: _____________________________________________________________________             [facility] - Preparing for Surgery Before surgery, you can play an important role.  Because skin is not  sterile, your skin needs to be as free of germs as possible.  You can reduce the number of germs on your skin by washing with CHG (chlorahexidine gluconate) soap before surgery.  CHG is an antiseptic cleaner which kills germs and bonds with the skin to continue killing germs even after washing. Please DO NOT use if you have an allergy to CHG or antibacterial soaps.  If your skin becomes reddened/irritated stop using the CHG and inform your nurse when you arrive at Short Stay. Do not shave (including legs and underarms) for at least 48 hours prior to the first CHG shower.  You may shave your face/neck. Please follow these instructions carefully:  1.  Shower with CHG Soap the night before surgery and the  morning of Surgery.  2.  If you choose to wash your hair, wash your hair first as usual with your  normal  shampoo.  3.  After you shampoo, rinse your hair and body thoroughly to remove the  shampoo.                           4.  Use CHG as you would any other liquid soap.  You can apply chg directly  to the skin and wash                       Gently with a scrungie or clean washcloth.  5.  Apply the CHG Soap to your body ONLY FROM THE NECK DOWN.   Do not use on face/ open                           Wound or open sores. Avoid contact with eyes, ears mouth and genitals (private parts).                       Wash face,  Genitals (private parts) with your  normal soap.             6.  Wash thoroughly, paying special attention to the area where your surgery  will be performed.  7.  Thoroughly rinse your body with warm water from the neck down.  8.  DO NOT shower/wash with your normal soap after using and rinsing off  the CHG Soap.                9.  Pat yourself dry with a clean towel.            10.  Wear clean pajamas.            11.  Place clean sheets on your bed the night of your first shower and do not  sleep with pets. Day of Surgery : Do not apply any lotions/deodorants the morning of surgery.   Please wear clean clothes to the hospital/surgery center.  FAILURE TO FOLLOW THESE INSTRUCTIONS MAY RESULT IN THE CANCELLATION OF YOUR SURGERY PATIENT SIGNATURE_________________________________  NURSE SIGNATURE__________________________________  ________________________________________________________________________   Mariah Mcclure  An incentive spirometer is a tool that can help keep your lungs clear and active. This tool measures how well you are filling your lungs with each breath. Taking long deep breaths may help reverse or decrease the chance of developing breathing (pulmonary) problems (especially infection) following:  A long period of time when you are unable to move or be active. BEFORE THE PROCEDURE   If the spirometer includes an indicator to show your best effort, your nurse or respiratory therapist will set it to a desired goal.  If possible, sit up straight or lean slightly forward. Try not to slouch.  Hold the incentive spirometer in an upright position. INSTRUCTIONS FOR USE  1. Sit on the edge of your bed if possible, or sit up as far as you can in bed or on a chair. 2. Hold the incentive spirometer in an upright position. 3. Breathe out normally. 4. Place the mouthpiece in your mouth and seal your lips tightly around it. 5. Breathe in slowly and as deeply as possible, raising the piston or the ball toward the top of the column. 6. Hold your breath for 3-5 seconds or for as long as possible. Allow the piston or ball to fall to the bottom of the column. 7. Remove the mouthpiece from your mouth and breathe out normally. 8. Rest for a few seconds and repeat Steps 1 through 7 at least 10 times every 1-2 hours when you are awake. Take your time and take a few normal breaths between deep breaths. 9. The spirometer may include an indicator to show your best effort. Use the indicator as a goal to work toward during each repetition. 10. After each set of 10 deep  breaths, practice coughing to be sure your lungs are clear. If you have an incision (the cut made at the time of surgery), support your incision when coughing by placing a pillow or rolled up towels firmly against it. Once you are able to get out of bed, walk around indoors and cough well. You may stop using the incentive spirometer when instructed by your caregiver.  RISKS AND COMPLICATIONS  Take your time so you do not get dizzy or light-headed.  If you are in pain, you may need to take or ask for pain medication before doing incentive spirometry. It is harder to take a deep breath if you are having pain. AFTER USE  Rest and breathe slowly  and easily.  It can be helpful to keep track of a log of your progress. Your caregiver can provide you with a simple table to help with this. If you are using the spirometer at home, follow these instructions: [place] IF:   You are having difficultly using the spirometer.  You have trouble using the spirometer as often as instructed.  Your pain medication is not giving enough relief while using the spirometer.  You develop fever of 100.5 F (38.1 C) or higher. SEEK IMMEDIATE MEDICAL CARE IF:   You cough up bloody sputum that had not been present before.  You develop fever of 102 F (38.9 C) or greater.  You develop worsening pain at or near the incision site. MAKE SURE YOU:   Understand these instructions.  Will watch your condition.  Will get help right away if you are not doing well or get worse. Document Released: 11/27/2006 Document Revised: 10/09/2011 Document Reviewed: 01/28/2007 Exit[employer] Patient Information 2014 Exit[employer], [place].   ________________________________________________________________________  WHAT IS A BLOOD TRANSFUSION? Blood Transfusion Information  A transfusion is the replacement of blood or some of its parts. Blood is made up of multiple cells which provide different functions.  Red blood cells  carry oxygen and are used for blood loss replacement.  White blood cells fight against infection.  Platelets control bleeding.  Plasma helps clot blood.  Other blood products are available for specialized needs, such as hemophilia or other clotting disorders. BEFORE THE TRANSFUSION  Who gives blood for transfusions?   Healthy volunteers who are fully evaluated to make sure their blood is safe. This is blood bank blood. Transfusion therapy is the safest it has ever been in the practice of medicine. Before blood is taken from a donor, a complete history is taken to make sure that person has no history of diseases nor engages in risky social behavior (examples are intravenous drug use or sexual activity with multiple partners). The donor's travel history is screened to minimize risk of transmitting infections, such as malaria. The donated blood is tested for signs of infectious diseases, such as HIV and hepatitis. The blood is then tested to be sure it is compatible with you in order to minimize the chance of a transfusion reaction. If you or a relative donates blood, this is often done in anticipation of surgery and is not appropriate for emergency situations. It takes many days to process the donated blood. RISKS AND COMPLICATIONS Although transfusion therapy is very safe and saves many lives, the main dangers of transfusion include:   Getting an infectious disease.  Developing a transfusion reaction. This is an allergic reaction to something in the blood you were given. Every precaution is taken to prevent this. The decision to have a blood transfusion has been considered carefully by your caregiver before blood is given. Blood is not given unless the benefits outweigh the risks. AFTER THE TRANSFUSION  Right after receiving a blood transfusion, you will usually feel much better and more energetic. This is especially true if your red blood cells have gotten low (anemic). The transfusion raises  the level of the red blood cells which carry oxygen, and this usually causes an energy increase.  The nurse administering the transfusion will monitor you carefully for complications. HOME CARE INSTRUCTIONS  No special instructions are needed after a transfusion. You may find your energy is better. Speak with your caregiver about any limitations on activity for underlying diseases you may have. SEEK MEDICAL CARE IF:  Your condition is not improving after your transfusion.  You develop redness or irritation at the intravenous (IV) site. SEEK IMMEDIATE MEDICAL CARE IF:  Any of the following symptoms occur over the next 12 hours:  Shaking chills.  You have a temperature by mouth above 102 F (38.9 C), not controlled by medicine.  Chest, back, or muscle pain.  People around you feel you are not acting correctly or are confused.  Shortness of breath or difficulty breathing.  Dizziness and fainting.  You get a rash or develop hives.  You have a decrease in urine output.  Your urine turns a dark color or changes to pink, red, or brown. Any of the following symptoms occur over the next 10 days:  You have a temperature by mouth above 102 F (38.9 C), not controlled by medicine.  Shortness of breath.  Weakness after normal activity.  The white part of the eye turns yellow (jaundice).  You have a decrease in the amount of urine or are urinating less often.  Your urine turns a dark color or changes to pink, red, or brown. Document Released: 07/14/2000 Document Revised: 10/09/2011 Document Reviewed: 03/02/2008 [facility] Patient Information 2014 [place], [place].  _______________________________________________________________________

## 2018-09-25 ENCOUNTER — Telehealth: Payer: Self-pay | Admitting: *Deleted

## 2018-09-25 ENCOUNTER — Other Ambulatory Visit: Payer: Self-pay | Admitting: Gynecologic Oncology

## 2018-09-25 DIAGNOSIS — C541 Malignant neoplasm of endometrium: Secondary | ICD-10-CM

## 2018-09-25 NOTE — Telephone Encounter (Signed)
A worker from [facility] called regarding the need for a new number for the patient. Per the demographic sent by [facility] the number is [phone]

## 2018-09-26 ENCOUNTER — Telehealth: Payer: Self-pay | Admitting: Gynecologic Oncology

## 2018-09-26 NOTE — Telephone Encounter (Signed)
Patient's sister called stating they were exposed to the flu and are now having symptoms.  They both started tamiflu yesterday.  She called asking about her upcoming surgery.  She feels that her sister will still be able to go to her CT in the am.  Informed her that she also has a pre-op appt after and she states she was unaware of that.  Advised her that I would call pre-op and call her back.  Surgery moved from 3/3 to 3/5 per Dr. Denman George.  Waiting to hear from pre-op about appt and if anesthesia feels it is necessary to postpone her surgery longer.

## 2018-09-27 ENCOUNTER — Telehealth: Payer: Self-pay | Admitting: *Deleted

## 2018-09-27 ENCOUNTER — Ambulatory Visit (HOSPITAL_COMMUNITY): Admission: RE | Admit: 2018-09-27 | Payer: Medicare Other | Source: Ambulatory Visit

## 2018-09-27 ENCOUNTER — Telehealth: Payer: Self-pay | Admitting: Gynecologic Oncology

## 2018-09-27 ENCOUNTER — Encounter (HOSPITAL_COMMUNITY)
Admission: RE | Admit: 2018-09-27 | Discharge: 2018-09-27 | Disposition: A | Discharge status: Home / Self Care | Payer: Medicare Other | Source: Ambulatory Visit

## 2018-09-27 NOTE — Telephone Encounter (Signed)
Spoke with patient's sister.  Advised her that her sister's surgery would need to be postponed two weeks due to recent flu.  Surgery rescheduled for March 12.  She will contact the office about when they are available to reschedule the CT since the patient had a fever last evening.  No other concerns voiced. All questions answered.

## 2018-09-27 NOTE — Telephone Encounter (Signed)
Cancelled the patient's CT scan and per-op appt, patient has the flu and a fever. Called and left per-admission a message about the cancel appt, and to have Janett Billow APP call Melissa APP regarding rescheduling the appt

## 2018-09-30 ENCOUNTER — Telehealth: Payer: Self-pay | Admitting: *Deleted

## 2018-09-30 NOTE — Telephone Encounter (Signed)
CT scan moved to 3/10 at 2:30pm, called and gave the sister the appt. Also gave instructions. Post op appt moved to 4/15 and genetics was scheduled

## 2018-09-30 NOTE — Telephone Encounter (Signed)
Patient's sister called back and stated "My brother will be bringing her to the CT scan. HE wouldn't be in town until 3/9, please schedule for 3/10." Explained that I would move appt and call her back. Per Ardeen Fillers ok to leave message on machine.

## 2018-09-30 NOTE — Telephone Encounter (Signed)
Rescheduled CT scan from last week. Called and left the patient's sister a message to call the office back. Need to give the appt information and instructions fot the CT on 3/5

## 2018-10-03 ENCOUNTER — Other Ambulatory Visit (HOSPITAL_COMMUNITY): Payer: Medicare Other

## 2018-10-04 ENCOUNTER — Telehealth: Payer: Self-pay | Admitting: *Deleted

## 2018-10-04 NOTE — Telephone Encounter (Signed)
Patient's sister called and stated "My work is faxing over some paperwork for [employer]. They are trying to deny my FMLA since she is not my spouse or child. But my sister is development delayed and needs help with these appts. So when [facility] or Dr. Denman George fill out the paperwork, can they add that, please." Explained that I would give the message to [facility]

## 2018-10-07 ENCOUNTER — Telehealth: Payer: Self-pay | Admitting: *Deleted

## 2018-10-07 NOTE — Telephone Encounter (Signed)
Patient's sister called and ask that [place] APP call her back. Couple attempted made to ask for a message, sister decline to leave message. Ardeen Fillers stated she can be reached at [phone]

## 2018-10-08 ENCOUNTER — Encounter (HOSPITAL_COMMUNITY)
Admission: RE | Admit: 2018-10-08 | Discharge: 2018-10-08 | Disposition: A | Discharge status: Home / Self Care | Payer: Medicare Other | Source: Ambulatory Visit | Attending: Gynecologic Oncology | Admitting: Gynecologic Oncology

## 2018-10-08 ENCOUNTER — Telehealth: Payer: Self-pay | Admitting: Gynecologic Oncology

## 2018-10-08 ENCOUNTER — Ambulatory Visit (HOSPITAL_COMMUNITY)
Admission: RE | Admit: 2018-10-08 | Discharge: 2018-10-08 | Disposition: A | Discharge status: Home / Self Care | Payer: Medicare Other | Source: Ambulatory Visit | Attending: Gynecologic Oncology | Admitting: Gynecologic Oncology

## 2018-10-08 ENCOUNTER — Other Ambulatory Visit: Payer: Self-pay

## 2018-10-08 ENCOUNTER — Encounter (HOSPITAL_COMMUNITY): Payer: Self-pay

## 2018-10-08 DIAGNOSIS — Z79899 Other long term (current) drug therapy: Secondary | ICD-10-CM | POA: Diagnosis not present

## 2018-10-08 DIAGNOSIS — Z01812 Encounter for preprocedural laboratory examination: Secondary | ICD-10-CM

## 2018-10-08 DIAGNOSIS — Z8049 Family history of malignant neoplasm of other genital organs: Secondary | ICD-10-CM | POA: Diagnosis not present

## 2018-10-08 DIAGNOSIS — Z7984 Long term (current) use of oral hypoglycemic drugs: Secondary | ICD-10-CM | POA: Diagnosis not present

## 2018-10-08 DIAGNOSIS — G40909 Epilepsy, unspecified, not intractable, without status epilepticus: Secondary | ICD-10-CM | POA: Diagnosis not present

## 2018-10-08 DIAGNOSIS — C541 Malignant neoplasm of endometrium: Secondary | ICD-10-CM | POA: Insufficient documentation

## 2018-10-08 DIAGNOSIS — E119 Type 2 diabetes mellitus without complications: Secondary | ICD-10-CM | POA: Diagnosis not present

## 2018-10-08 DIAGNOSIS — R6250 Unspecified lack of expected normal physiological development in childhood: Secondary | ICD-10-CM | POA: Diagnosis not present

## 2018-10-08 LAB — COMPREHENSIVE METABOLIC PANEL
ALT: 13 U/L (ref 0–44)
AST: 18 U/L (ref 15–41)
Albumin: 4.3 g/dL (ref 3.5–5.0)
Alkaline Phosphatase: 88 U/L (ref 38–126)
Anion gap: 7 (ref 5–15)
BUN: 11 mg/dL (ref 6–20)
CO2: 27 mmol/L (ref 22–32)
Calcium: 9 mg/dL (ref 8.9–10.3)
Chloride: 98 mmol/L (ref 98–111)
Creatinine, Ser: 0.43 mg/dL — ABNORMAL LOW (ref 0.44–1.00)
GFR calc Af Amer: >60 mL/min (ref >60)
GFR calc non Af Amer: >60 mL/min (ref >60)
Glucose, Bld: 231 mg/dL — ABNORMAL HIGH (ref 70–99)
POTASSIUM: 4 mmol/L (ref 3.5–5.1)
SODIUM: 132 mmol/L — AB (ref 135–145)
Total Bilirubin: 0.2 mg/dL — ABNORMAL LOW (ref 0.3–1.2)
Total Protein: 7.5 g/dL (ref 6.5–8.1)

## 2018-10-08 LAB — HEMOGLOBIN A1C
Hgb A1c MFr Bld: 8 % — ABNORMAL HIGH (ref 4.8–5.6)
Mean Plasma Glucose: 182.9 mg/dL

## 2018-10-08 LAB — URINALYSIS, ROUTINE W REFLEX MICROSCOPIC
Bilirubin Urine: NEGATIVE
Glucose, UA: >=500 mg/dL — AB
Hgb urine dipstick: NEGATIVE
Ketones, ur: NEGATIVE mg/dL
Nitrite: NEGATIVE
PH: 6 (ref 5.0–8.0)
Protein, ur: NEGATIVE mg/dL
SPECIFIC GRAVITY, URINE: 1.016 (ref 1.005–1.030)

## 2018-10-08 LAB — CBC
HCT: 37.2 % (ref 36.0–46.0)
Hemoglobin: 12.1 g/dL (ref 12.0–15.0)
MCH: 30.3 pg (ref 26.0–34.0)
MCHC: 32.5 g/dL (ref 30.0–36.0)
MCV: 93 fL (ref 80.0–100.0)
PLATELETS: 298 10*3/uL (ref 150–400)
RBC: 4 MIL/uL (ref 3.87–5.11)
RDW: 14.1 % (ref 11.5–15.5)
WBC: 6.2 10*3/uL (ref 4.0–10.5)
nRBC: 0 % (ref 0.0–0.2)

## 2018-10-08 LAB — GLUCOSE, CAPILLARY: GLUCOSE-CAPILLARY: 250 mg/dL — AB (ref 70–99)

## 2018-10-08 LAB — ABO/RH: ABO/RH(D): A POS

## 2018-10-08 LAB — TYPE AND SCREEN
ABO/RH(D): A POS
Antibody Screen: NEGATIVE

## 2018-10-08 LAB — HCG, SERUM, QUALITATIVE: Preg, Serum: NEGATIVE

## 2018-10-08 MED ORDER — IOHEXOL 300 MG/ML  SOLN
100.0000 mL | Freq: Once | INTRAMUSCULAR | Status: AC | PRN
  Administered 2018-10-08: 100 mL via INTRAVENOUS

## 2018-10-08 MED ORDER — SODIUM CHLORIDE (PF) 0.9 % IJ SOLN
INTRAMUSCULAR | Status: AC
  Filled 2018-10-08: qty 50

## 2018-10-08 NOTE — Patient Instructions (Addendum)
Mariah Mcclure  10/08/2018   Your procedure is scheduled IB:[id number] 10/10/2018   Report to [facility] Main  Entrance              Report to admitting at 0800  AM    Call this number if you have problems the morning of surgery [phone]      Eat a light diet the day before surgery.  Examples including soups, broths, toast, yogurt, mashed potatoes.  Things to avoid include carbonated beverages (fizzy beverages), raw fruits and raw vegetables, or beans.   If your bowels are filled with gas, your surgeon will have difficulty visualizing your pelvic organs which increases your surgical risks.    CLEAR LIQUID DIET   Foods Allowed                                                                     Foods Excluded  Coffee and tea, regular and decaf                             liquids that you cannot  Plain Jell-O in any flavor                                             see through such as: Fruit ices (not with fruit pulp)                                     milk, soups, orange juice  Iced Popsicles                                    All solid food Carbonated beverages, regular and diet                                    Cranberry, grape and apple juices Sports drinks like Gatorade Lightly seasoned clear broth or consume(fat free) Sugar, honey syrup  Sample Menu Breakfast                                Lunch                                     Supper Cranberry juice                    Beef broth                            Chicken broth Jell-O  Grape juice                           Apple juice Coffee or tea                        Jell-O                                      Popsicle                                                Coffee or tea                        Coffee or tea  _____________________________________________________________________              Remember: Nothing to eat or drink after midnight! How to  Manage Your Diabetes Before and After Surgery  Why is it important to control my blood sugar before and after surgery? . Improving blood sugar levels before and after surgery helps healing and can limit problems. . A way of improving blood sugar control is eating a healthy diet by: o  Eating less sugar and carbohydrates o  Increasing activity/exercise o  Talking with your doctor about reaching your blood sugar goals . High blood sugars (greater than 180 mg/dL) can raise your risk of infections and slow your recovery, so you will need to focus on controlling your diabetes during the weeks before surgery. . Make sure that the doctor who takes care of your diabetes knows about your planned surgery including the date and location.  How do I manage my blood sugar before surgery? . Check your blood sugar at least 4 times a day, starting 2 days before surgery, to make sure that the level is not too high or low. o Check your blood sugar the morning of your surgery when you wake up and every 2 hours until you get to the Short Stay unit. . If your blood sugar is less than 70 mg/dL, you will need to treat for low blood sugar: o Do not take insulin. o Treat a low blood sugar (less than 70 mg/dL) with  cup of clear juice (cranberry or apple), 4 glucose tablets, OR glucose gel. o Recheck blood sugar in 15 minutes after treatment (to make sure it is greater than 70 mg/dL). If your blood sugar is not greater than 70 mg/dL on recheck, call [phone] for further instructions. . Report your blood sugar to the short stay nurse when you get to Short Stay.  . If you are admitted to the hospital after surgery: o Your blood sugar will be checked by the staff and you will probably be given insulin after surgery (instead of oral diabetes medicines) to make sure you have good blood sugar levels. o The goal for blood sugar control after surgery is 80-180 mg/dL.   WHAT DO I DO ABOUT MY DIABETES MEDICATION?         The day before surgery, Take Metformin  As usual!  . Do not take oral diabetes medicines (pills) the morning of surgery.  BRUSH YOUR TEETH MORNING OF SURGERY AND RINSE YOUR MOUTH OUT, NO CHEWING GUM CANDY OR MINTS.     Take these medicines the morning of surgery with A SIP OF WATER:Carbamazepine (Tegretol), Rosuvastatin (Crestor)   DO NOT TAKE ANY DIABETIC MEDICATIONS DAY OF YOUR SURGERY!                               You may not have any metal on your body including hair pins and              piercings  Do not wear jewelry, make-up, lotions, powders or perfumes, deodorant             Do not wear nail polish.  Do not shave  48 hours prior to surgery.               Do not bring valuables to the hospital. [place].  Contacts, dentures or bridgework may not be worn into surgery.  Leave suitcase in the car. After surgery it may be brought to your room.     Patients discharged the day of surgery will not be allowed to drive home. IF YOU ARE HAVING SURGERY AND GOING HOME THE SAME DAY, YOU MUST HAVE AN ADULT TO DRIVE YOU HOME AND BE WITH YOU FOR 24 HOURS. YOU MAY GO HOME BY TAXI OR UBER OR ORTHERWISE, BUT AN ADULT MUST ACCOMPANY YOU HOME AND STAY WITH YOU FOR 24 HOURS.  Name and phone number of your driver:brother-David and sister-Greta                Please read over the following fact sheets you were given: _____________________________________________________________________             [facility] - Preparing for Surgery Before surgery, you can play an important role.  Because skin is not sterile, your skin needs to be as free of germs as possible.  You can reduce the number of germs on your skin by washing with CHG (chlorahexidine gluconate) soap before surgery.  CHG is an antiseptic cleaner which kills germs and bonds with the skin to continue killing germs even after washing. Please DO NOT use if you have an  allergy to CHG or antibacterial soaps.  If your skin becomes reddened/irritated stop using the CHG and inform your nurse when you arrive at Short Stay. Do not shave (including legs and underarms) for at least 48 hours prior to the first CHG shower.  You may shave your face/neck. Please follow these instructions carefully:  1.  Shower with CHG Soap the night before surgery and the  morning of Surgery.  2.  If you choose to wash your hair, wash your hair first as usual with your  normal  shampoo.  3.  After you shampoo, rinse your hair and body thoroughly to remove the  shampoo.                           4.  Use CHG as you would any other liquid soap.  You can apply chg directly  to the skin and wash                       Gently with a scrungie or clean washcloth.  5.  Apply the CHG Soap to your body ONLY FROM THE NECK DOWN.   Do not use on face/ open                           Wound or open sores. Avoid contact with eyes, ears mouth and genitals (private parts).                       Wash face,  Genitals (private parts) with your normal soap.             6.  Wash thoroughly, paying special attention to the area where your surgery  will be performed.  7.  Thoroughly rinse your body with warm water from the neck down.  8.  DO NOT shower/wash with your normal soap after using and rinsing off  the CHG Soap.                9.  Pat yourself dry with a clean towel.            10.  Wear clean pajamas.            11.  Place clean sheets on your bed the night of your first shower and do not  sleep with pets. Day of Surgery : Do not apply any lotions/deodorants the morning of surgery.  Please wear clean clothes to the hospital/surgery center.  FAILURE TO FOLLOW THESE INSTRUCTIONS MAY RESULT IN THE CANCELLATION OF YOUR SURGERY PATIENT SIGNATURE_________________________________  NURSE  SIGNATURE__________________________________  ________________________________________________________________________   Adam Phenix  An incentive spirometer is a tool that can help keep your lungs clear and active. This tool measures how well you are filling your lungs with each breath. Taking long deep breaths may help reverse or decrease the chance of developing breathing (pulmonary) problems (especially infection) following:  A long period of time when you are unable to move or be active. BEFORE THE PROCEDURE   If the spirometer includes an indicator to show your best effort, your nurse or respiratory therapist will set it to a desired goal.  If possible, sit up straight or lean slightly forward. Try not to slouch.  Hold the incentive spirometer in an upright position. INSTRUCTIONS FOR USE  1. Sit on the edge of your bed if possible, or sit up as far as you can in bed or on a chair. 2. Hold the incentive spirometer in an upright position. 3. Breathe out normally. 4. Place the mouthpiece in your mouth and seal your lips tightly around it. 5. Breathe in slowly and as deeply as possible, raising the piston or the ball toward the top of the column. 6. Hold your breath for 3-5 seconds or for as long as possible. Allow the piston or ball to fall to the bottom of the column. 7. Remove the mouthpiece from your mouth and breathe out normally. 8. Rest for a few seconds and repeat Steps 1 through 7 at least 10 times every 1-2 hours when you are awake. Take your time and take a few normal breaths between deep breaths. 9. The spirometer may include an indicator to show your best effort. Use the indicator as a goal to work toward during each repetition. 10. After each set of 10 deep breaths, practice coughing to be sure your lungs are clear. If you have an incision (the cut made at the time of surgery), support your incision when coughing by placing a pillow or  rolled up towels firmly  against it. Once you are able to get out of bed, walk around indoors and cough well. You may stop using the incentive spirometer when instructed by your caregiver.  RISKS AND COMPLICATIONS  Take your time so you do not get dizzy or light-headed.  If you are in pain, you may need to take or ask for pain medication before doing incentive spirometry. It is harder to take a deep breath if you are having pain. AFTER USE  Rest and breathe slowly and easily.  It can be helpful to keep track of a log of your progress. Your caregiver can provide you with a simple table to help with this. If you are using the spirometer at home, follow these instructions: [place] IF:   You are having difficultly using the spirometer.  You have trouble using the spirometer as often as instructed.  Your pain medication is not giving enough relief while using the spirometer.  You develop fever of 100.5 F (38.1 C) or higher. SEEK IMMEDIATE MEDICAL CARE IF:   You cough up bloody sputum that had not been present before.  You develop fever of 102 F (38.9 C) or greater.  You develop worsening pain at or near the incision site. MAKE SURE YOU:   Understand these instructions.  Will watch your condition.  Will get help right away if you are not doing well or get worse. Document Released: 11/27/2006 Document Revised: 10/09/2011 Document Reviewed: 01/28/2007 ExitCare Patient Information 2014 ExitCare, [place].   ________________________________________________________________________  WHAT IS A BLOOD TRANSFUSION? Blood Transfusion Information  A transfusion is the replacement of blood or some of its parts. Blood is made up of multiple cells which provide different functions.  Red blood cells carry oxygen and are used for blood loss replacement.  White blood cells fight against infection.  Platelets control bleeding.  Plasma helps clot blood.  Other blood products are available for  specialized needs, such as hemophilia or other clotting disorders. BEFORE THE TRANSFUSION  Who gives blood for transfusions?   Healthy volunteers who are fully evaluated to make sure their blood is safe. This is blood bank blood. Transfusion therapy is the safest it has ever been in the practice of medicine. Before blood is taken from a donor, a complete history is taken to make sure that person has no history of diseases nor engages in risky social behavior (examples are intravenous drug use or sexual activity with multiple partners). The donor's travel history is screened to minimize risk of transmitting infections, such as malaria. The donated blood is tested for signs of infectious diseases, such as HIV and hepatitis. The blood is then tested to be sure it is compatible with you in order to minimize the chance of a transfusion reaction. If you or a relative donates blood, this is often done in anticipation of surgery and is not appropriate for emergency situations. It takes many days to process the donated blood. RISKS AND COMPLICATIONS Although transfusion therapy is very safe and saves many lives, the main dangers of transfusion include:   Getting an infectious disease.  Developing a transfusion reaction. This is an allergic reaction to something in the blood you were given. Every precaution is taken to prevent this. The decision to have a blood transfusion has been considered carefully by your caregiver before blood is given. Blood is not given unless the benefits outweigh the risks. AFTER THE TRANSFUSION  Right after receiving a blood transfusion, you will usually  feel much better and more energetic. This is especially true if your red blood cells have gotten low (anemic). The transfusion raises the level of the red blood cells which carry oxygen, and this usually causes an energy increase.  The nurse administering the transfusion will monitor you carefully for complications. HOME CARE  INSTRUCTIONS  No special instructions are needed after a transfusion. You may find your energy is better. Speak with your caregiver about any limitations on activity for underlying diseases you may have. SEEK MEDICAL CARE IF:   Your condition is not improving after your transfusion.  You develop redness or irritation at the intravenous (IV) site. SEEK IMMEDIATE MEDICAL CARE IF:  Any of the following symptoms occur over the next 12 hours:  Shaking chills.  You have a temperature by mouth above 102 F (38.9 C), not controlled by medicine.  Chest, back, or muscle pain.  People around you feel you are not acting correctly or are confused.  Shortness of breath or difficulty breathing.  Dizziness and fainting.  You get a rash or develop hives.  You have a decrease in urine output.  Your urine turns a dark color or changes to pink, red, or brown. Any of the following symptoms occur over the next 10 days:  You have a temperature by mouth above 102 F (38.9 C), not controlled by medicine.  Shortness of breath.  Weakness after normal activity.  The white part of the eye turns yellow (jaundice).  You have a decrease in the amount of urine or are urinating less often.  Your urine turns a dark color or changes to pink, red, or brown. Document Released: 07/14/2000 Document Revised: 10/09/2011 Document Reviewed: 03/02/2008 [employer] Patient Information 2014 [place], [place].  _______________________________________________________________________

## 2018-10-08 NOTE — Telephone Encounter (Signed)
Spoke with patient's sister about pre-op labs and the need for tight glucose control moving forward.  Advised that a HgbA1C is pending.  All labs reviewed.  Advised she would be contacted with the results of her CT scan.  No concerns voiced.  Advised to call for any needs or concerns.

## 2018-10-08 NOTE — Progress Notes (Signed)
Chart given to Ward Givens for Anesthesia to review labs.

## 2018-10-09 ENCOUNTER — Telehealth: Payer: Self-pay

## 2018-10-09 NOTE — Progress Notes (Signed)
Anesthesia Chart Review   Case:  [id number] Date/Time:  10/10/18 0945   Procedures:      XI ROBOTIC ASSISTED TOTAL HYSTERECTOMY WITH BILATERAL SALPINGO OOPHORECTOMY (Bilateral )     SENTINEL  LYMPH NODE BIOPSY (N/A )   Anesthesia type:  General   Pre-op diagnosis:  ENDOMETRIAL CANCER   Location:  [place] [facility] / [facility] ORS   Surgeon:  [name], MD      DISCUSSION: 53 yo never smoker with h/o anemia, DM II, Hepatitis C (treated), seizures (last seizure 30 years ago), developmental delay, endometrial cancer scheduled for above procedure 10/10/18 with Dr. [name].   Surgery previously postponed due to flu diagnosis.  Pt started on Tamiflu 09/26/18, stable at PAT visit, asymptomatic.   Glucose 250, A1C 8.0 at PAT visit 10/08/18.  Reviewed by Joylene John, NP.  Pt's sister reports she will be managing medications more closely from now on.  Will evaluate DOS.    Pt can proceed with planned procedure barring acute status change.  VS: LMP 09/09/2018 (Approximate)   PROVIDERS: System, Pcp Not In   LABS: Labs reviewed: Acceptable for surgery. (all labs ordered are listed, but only abnormal results are displayed)  Labs Reviewed  GLUCOSE, CAPILLARY - Abnormal; Notable for the following components:      Result Value   Glucose-Capillary 250 (*)    All other components within normal limits  HEMOGLOBIN A1C - Abnormal; Notable for the following components:   Hgb A1c MFr Bld 8.0 (*)    All other components within normal limits  COMPREHENSIVE METABOLIC PANEL - Abnormal; Notable for the following components:   Sodium 132 (*)    Glucose, Bld 231 (*)    Creatinine, Ser 0.43 (*)    Total Bilirubin 0.2 (*)    All other components within normal limits  URINALYSIS, ROUTINE W REFLEX MICROSCOPIC - Abnormal; Notable for the following components:   Glucose, UA >=500 (*)    Leukocytes,Ua TRACE (*)    Bacteria, UA RARE (*)    All other components within normal limits  CBC  HCG, SERUM, QUALITATIVE  TYPE  AND SCREEN  ABO/RH     IMAGES: CT Abdomen Pelvis 10/08/18 IMPRESSION: 1. No lymphadenopathy or other findings of metastatic disease in the abdomen or pelvis. 2. Nonspecific mildly enlarged anteverted heterogeneous uterus without discrete uterine mass by CT. No adnexal masses.  EKG: 09/16/18 Rate 74 bpm Normal sinus rhythm   CV:  Past Medical History:  Diagnosis Date  . Abnormal perimenopausal bleeding   . Anemia    due to bleeding  . Developmental delay   . Diabetes mellitus without complication ([place])    type 2  . Heart murmur   . Hepatitis 1990's    Patient states that she has hep A yrs ago took tx  . Seizure ([place]) 30 yrs ago    Past Surgical History:  Procedure Laterality Date  . CHOLECYSTECTOMY  20 yrs ago  . DILATATION & CURETTAGE/HYSTEROSCOPY WITH MYOSURE N/A 09/16/2018   Procedure: DILATATION & CURETTAGE/HYSTEROSCOPY WITH MYOSURE;  Surgeon: Servando Salina, MD;  Location: [place];  Service: Gynecology;  Laterality: N/A;  . EYE SURGERY     Patient was 53 years old.    MEDICATIONS: . carbamazepine (TEGRETOL XR) 400 MG 12 hr tablet  . ibuprofen (ADVIL,MOTRIN) 800 MG tablet  . metFORMIN (GLUCOPHAGE) 500 MG tablet  . oxyCODONE (OXY IR/ROXICODONE) 5 MG immediate release tablet  . rosuvastatin (CRESTOR) 20 MG tablet  . senna-docusate (  SENOKOT-S) 8.6-50 MG tablet   No current facility-administered medications for this encounter.     Maia Plan [facility] Pre-Surgical Testing [phone] 10/09/18 1:13 PM

## 2018-10-09 NOTE — Telephone Encounter (Signed)
Told sister Alvis Lemmings that the ct scan showed no evidence of gross metastatic disease.  HgA1c was 8.0 yesterday. Alvis Lemmings will be taking overseeing her sister's blood sugars.

## 2018-10-09 NOTE — Anesthesia Preprocedure Evaluation (Addendum)
Anesthesia Evaluation  Patient identified by MRN, date of birth, ID band Patient awake    Reviewed: Allergy & Precautions, NPO status , Patient's Chart, lab work & pertinent test results  History of Anesthesia Complications Negative for: history of anesthetic complications  Airway Mallampati: II  TM Distance: >3 FB Neck ROM: Full    Dental  (+) Teeth Intact   Pulmonary neg pulmonary ROS,    breath sounds clear to auscultation       Cardiovascular negative cardio ROS   Rhythm:Regular     Neuro/Psych Seizures -, Well Controlled,  negative psych ROS   GI/Hepatic negative GI ROS, (+) Hepatitis -, C  Endo/Other  diabetes, Type 2, Oral Hypoglycemic Agents  Renal/GU negative Renal ROS     Musculoskeletal   Abdominal   Peds  Hematology  (+) anemia ,   Anesthesia Other Findings DISCUSSION: 53 yo never smoker with h/o anemia, DM II, Hepatitis C (treated), seizures (last seizure 30 years ago), developmental delay, endometrial cancer scheduled for above procedure 10/10/18 with Dr. [name].   Reproductive/Obstetrics                            Anesthesia Physical Anesthesia Plan  ASA: II  Anesthesia Plan: General   Post-op Pain Management:    Induction: Intravenous  PONV Risk Score and Plan: 3 and Ondansetron and Dexamethasone  Airway Management Planned: Oral ETT  Additional Equipment: None  Intra-op Plan:   Post-operative Plan: Extubation in OR  Informed Consent: I have reviewed the patients History and Physical, chart, labs and discussed the procedure including the risks, benefits and alternatives for the proposed anesthesia with the patient or authorized representative who has indicated his/her understanding and acceptance.     Dental advisory given  Plan Discussed with: CRNA and Surgeon  Anesthesia Plan Comments: (See PAT note 10/08/18, Konrad Felix, PA-C)        Anesthesia Quick Evaluation

## 2018-10-10 ENCOUNTER — Other Ambulatory Visit: Payer: Self-pay

## 2018-10-10 ENCOUNTER — Ambulatory Visit (HOSPITAL_COMMUNITY)
Admission: RE | Admit: 2018-10-10 | Discharge: 2018-10-10 | Disposition: A | Discharge status: Home / Self Care | Payer: Medicare Other | Attending: Gynecologic Oncology | Admitting: Gynecologic Oncology

## 2018-10-10 ENCOUNTER — Encounter (HOSPITAL_COMMUNITY)
Admission: RE | Disposition: A | Discharge status: Home / Self Care | Payer: Self-pay | Source: Home / Self Care | Attending: Gynecologic Oncology

## 2018-10-10 ENCOUNTER — Ambulatory Visit (HOSPITAL_COMMUNITY): Payer: Medicare Other | Admitting: Anesthesiology

## 2018-10-10 ENCOUNTER — Encounter (HOSPITAL_COMMUNITY): Payer: Self-pay | Admitting: Anesthesiology

## 2018-10-10 ENCOUNTER — Ambulatory Visit (HOSPITAL_COMMUNITY): Payer: Medicare Other | Admitting: Physician Assistant

## 2018-10-10 DIAGNOSIS — C541 Malignant neoplasm of endometrium: Secondary | ICD-10-CM | POA: Diagnosis not present

## 2018-10-10 DIAGNOSIS — Z8049 Family history of malignant neoplasm of other genital organs: Secondary | ICD-10-CM | POA: Insufficient documentation

## 2018-10-10 DIAGNOSIS — R6250 Unspecified lack of expected normal physiological development in childhood: Secondary | ICD-10-CM | POA: Insufficient documentation

## 2018-10-10 DIAGNOSIS — Z7984 Long term (current) use of oral hypoglycemic drugs: Secondary | ICD-10-CM | POA: Insufficient documentation

## 2018-10-10 DIAGNOSIS — Z79899 Other long term (current) drug therapy: Secondary | ICD-10-CM | POA: Insufficient documentation

## 2018-10-10 DIAGNOSIS — G40909 Epilepsy, unspecified, not intractable, without status epilepticus: Secondary | ICD-10-CM | POA: Insufficient documentation

## 2018-10-10 DIAGNOSIS — E119 Type 2 diabetes mellitus without complications: Secondary | ICD-10-CM | POA: Insufficient documentation

## 2018-10-10 HISTORY — PX: ROBOTIC ASSISTED TOTAL HYSTERECTOMY WITH BILATERAL SALPINGO OOPHERECTOMY: SHX6086

## 2018-10-10 HISTORY — PX: LYMPH NODE BIOPSY: SHX201

## 2018-10-10 LAB — URINE CULTURE

## 2018-10-10 LAB — GLUCOSE, CAPILLARY: Glucose-Capillary: 174 mg/dL — ABNORMAL HIGH (ref 70–99)

## 2018-10-10 SURGERY — HYSTERECTOMY, TOTAL, ROBOT-ASSISTED, LAPAROSCOPIC, WITH BILATERAL SALPINGO-OOPHORECTOMY
Anesthesia: General

## 2018-10-10 MED ORDER — BUPIVACAINE HCL (PF) 0.25 % IJ SOLN
INTRAMUSCULAR | Status: DC | PRN
  Administered 2018-10-10: 18 mL

## 2018-10-10 MED ORDER — PROPOFOL 10 MG/ML IV BOLUS
INTRAVENOUS | Status: DC | PRN
  Administered 2018-10-10: 100 mg via INTRAVENOUS

## 2018-10-10 MED ORDER — ENOXAPARIN SODIUM 40 MG/0.4ML ~~LOC~~ SOLN
SUBCUTANEOUS | Status: AC
  Administered 2018-10-10: 40 mg via SUBCUTANEOUS
  Filled 2018-10-10: qty 0.4

## 2018-10-10 MED ORDER — FENTANYL CITRATE (PF) 100 MCG/2ML IJ SOLN: 25.0000 ug | INTRAMUSCULAR | Status: DC | PRN

## 2018-10-10 MED ORDER — ACETAMINOPHEN 10 MG/ML IV SOLN: 1000.0000 mg | Freq: Once | INTRAVENOUS | Status: DC | PRN

## 2018-10-10 MED ORDER — ACETAMINOPHEN 160 MG/5ML PO SOLN: 1000.0000 mg | Freq: Once | ORAL | Status: DC | PRN

## 2018-10-10 MED ORDER — ROCURONIUM BROMIDE 10 MG/ML (PF) SYRINGE
PREFILLED_SYRINGE | INTRAVENOUS | Status: DC | PRN
  Administered 2018-10-10: 50 mg via INTRAVENOUS
  Administered 2018-10-10: 10 mg via INTRAVENOUS
  Administered 2018-10-10 (×3): 20 mg via INTRAVENOUS
  Administered 2018-10-10: 10 mg via INTRAVENOUS

## 2018-10-10 MED ORDER — DEXAMETHASONE SODIUM PHOSPHATE 4 MG/ML IJ SOLN: 4.0000 mg | INTRAMUSCULAR | Status: DC

## 2018-10-10 MED ORDER — CELECOXIB 200 MG PO CAPS
ORAL_CAPSULE | ORAL | Status: AC
  Administered 2018-10-10: 400 mg via ORAL
  Filled 2018-10-10: qty 2

## 2018-10-10 MED ORDER — ONDANSETRON HCL 4 MG/2ML IJ SOLN
INTRAMUSCULAR | Status: DC | PRN
  Administered 2018-10-10: 4 mg via INTRAVENOUS

## 2018-10-10 MED ORDER — STERILE WATER FOR INJECTION IJ SOLN
INTRAMUSCULAR | Status: AC
  Filled 2018-10-10: qty 10

## 2018-10-10 MED ORDER — MIDAZOLAM HCL 2 MG/2ML IJ SOLN
INTRAMUSCULAR | Status: AC
  Filled 2018-10-10: qty 2

## 2018-10-10 MED ORDER — ACETAMINOPHEN 500 MG PO TABS
1000.0000 mg | ORAL_TABLET | ORAL | Status: AC
  Administered 2018-10-10: 1000 mg via ORAL

## 2018-10-10 MED ORDER — FENTANYL CITRATE (PF) 250 MCG/5ML IJ SOLN
INTRAMUSCULAR | Status: DC | PRN
  Administered 2018-10-10 (×3): 50 ug via INTRAVENOUS
  Administered 2018-10-10: 100 ug via INTRAVENOUS
  Administered 2018-10-10: 50 ug via INTRAVENOUS

## 2018-10-10 MED ORDER — LACTATED RINGERS IV SOLN
INTRAVENOUS | Status: DC | PRN
  Administered 2018-10-10 (×2): via INTRAVENOUS

## 2018-10-10 MED ORDER — SCOPOLAMINE 1 MG/3DAYS TD PT72
1.0000 | MEDICATED_PATCH | TRANSDERMAL | Status: DC
  Administered 2018-10-10: 1.5 mg via TRANSDERMAL

## 2018-10-10 MED ORDER — DEXAMETHASONE SODIUM PHOSPHATE 10 MG/ML IJ SOLN
INTRAMUSCULAR | Status: AC
  Filled 2018-10-10: qty 1

## 2018-10-10 MED ORDER — CELECOXIB 200 MG PO CAPS
400.0000 mg | ORAL_CAPSULE | ORAL | Status: AC
  Administered 2018-10-10: 400 mg via ORAL

## 2018-10-10 MED ORDER — BUPIVACAINE HCL (PF) 0.25 % IJ SOLN
INTRAMUSCULAR | Status: AC
  Filled 2018-10-10: qty 30

## 2018-10-10 MED ORDER — SCOPOLAMINE 1 MG/3DAYS TD PT72
MEDICATED_PATCH | TRANSDERMAL | Status: AC
  Administered 2018-10-10: 1.5 mg via TRANSDERMAL
  Filled 2018-10-10: qty 1

## 2018-10-10 MED ORDER — CEFAZOLIN SODIUM-DEXTROSE 2-4 GM/100ML-% IV SOLN
INTRAVENOUS | Status: AC
  Filled 2018-10-10: qty 100

## 2018-10-10 MED ORDER — SUGAMMADEX SODIUM 200 MG/2ML IV SOLN
INTRAVENOUS | Status: AC
  Filled 2018-10-10: qty 2

## 2018-10-10 MED ORDER — PROPOFOL 10 MG/ML IV BOLUS
INTRAVENOUS | Status: AC
  Filled 2018-10-10: qty 20

## 2018-10-10 MED ORDER — STERILE WATER FOR INJECTION IJ SOLN
INTRAMUSCULAR | Status: DC | PRN
  Administered 2018-10-10: 4 mL

## 2018-10-10 MED ORDER — ROCURONIUM BROMIDE 10 MG/ML (PF) SYRINGE
PREFILLED_SYRINGE | INTRAVENOUS | Status: AC
  Filled 2018-10-10: qty 10

## 2018-10-10 MED ORDER — LIDOCAINE 2% (20 MG/ML) 5 ML SYRINGE
INTRAMUSCULAR | Status: AC
  Filled 2018-10-10: qty 5

## 2018-10-10 MED ORDER — ONDANSETRON HCL 4 MG/2ML IJ SOLN
INTRAMUSCULAR | Status: AC
  Filled 2018-10-10: qty 2

## 2018-10-10 MED ORDER — SUGAMMADEX SODIUM 200 MG/2ML IV SOLN
INTRAVENOUS | Status: DC | PRN
  Administered 2018-10-10: 130 mg via INTRAVENOUS

## 2018-10-10 MED ORDER — ACETAMINOPHEN 500 MG PO TABS: 1000.0000 mg | ORAL_TABLET | Freq: Once | ORAL | Status: DC | PRN

## 2018-10-10 MED ORDER — GABAPENTIN 300 MG PO CAPS
ORAL_CAPSULE | ORAL | Status: AC
  Filled 2018-10-10: qty 1

## 2018-10-10 MED ORDER — OXYCODONE HCL 5 MG PO TABS: 5.0000 mg | ORAL_TABLET | Freq: Once | ORAL | Status: DC | PRN

## 2018-10-10 MED ORDER — INDOCYANINE GREEN 25 MG IV SOLR
INTRAVENOUS | Status: DC | PRN
  Administered 2018-10-10: 2.5 mg

## 2018-10-10 MED ORDER — ACETAMINOPHEN 500 MG PO TABS
ORAL_TABLET | ORAL | Status: AC
  Administered 2018-10-10: 1000 mg via ORAL
  Filled 2018-10-10: qty 1

## 2018-10-10 MED ORDER — MIDAZOLAM HCL 2 MG/2ML IJ SOLN
INTRAMUSCULAR | Status: DC | PRN
  Administered 2018-10-10: 2 mg via INTRAVENOUS

## 2018-10-10 MED ORDER — DEXAMETHASONE SODIUM PHOSPHATE 10 MG/ML IJ SOLN
INTRAMUSCULAR | Status: DC | PRN
  Administered 2018-10-10: 10 mg via INTRAVENOUS

## 2018-10-10 MED ORDER — OXYCODONE HCL 5 MG/5ML PO SOLN: 5.0000 mg | Freq: Once | ORAL | Status: DC | PRN

## 2018-10-10 MED ORDER — LIDOCAINE 2% (20 MG/ML) 5 ML SYRINGE
INTRAMUSCULAR | Status: DC | PRN
  Administered 2018-10-10: 60 mg via INTRAVENOUS

## 2018-10-10 MED ORDER — LACTATED RINGERS IV SOLN: INTRAVENOUS | Status: DC | PRN

## 2018-10-10 MED ORDER — CEFAZOLIN SODIUM-DEXTROSE 2-4 GM/100ML-% IV SOLN
2.0000 g | INTRAVENOUS | Status: AC
  Administered 2018-10-10: 2 g via INTRAVENOUS

## 2018-10-10 MED ORDER — ENOXAPARIN SODIUM 40 MG/0.4ML ~~LOC~~ SOLN
40.0000 mg | SUBCUTANEOUS | Status: AC
  Administered 2018-10-10: 40 mg via SUBCUTANEOUS

## 2018-10-10 MED ORDER — LACTATED RINGERS IR SOLN
Status: DC | PRN
  Administered 2018-10-10: 1000 mL

## 2018-10-10 MED ORDER — FENTANYL CITRATE (PF) 250 MCG/5ML IJ SOLN
INTRAMUSCULAR | Status: AC
  Filled 2018-10-10: qty 5

## 2018-10-10 SURGICAL SUPPLY — 57 items
APPLICATOR SURGIFLO ENDO (HEMOSTASIS) IMPLANT
BAG LAPAROSCOPIC 12 15 PORT 16 (BASKET) IMPLANT
BAG RETRIEVAL 12/15 (BASKET)
COVER BACK TABLE 60X90IN (DRAPES) ×3 IMPLANT
COVER TIP SHEARS 8 DVNC (MISCELLANEOUS) ×2 IMPLANT
COVER TIP SHEARS 8MM DA VINCI (MISCELLANEOUS) ×1
COVER WAND RF STERILE (DRAPES) IMPLANT
DECANTER SPIKE VIAL GLASS SM (MISCELLANEOUS) ×6 IMPLANT
DERMABOND ADVANCED (GAUZE/BANDAGES/DRESSINGS) ×1
DERMABOND ADVANCED .7 DNX12 (GAUZE/BANDAGES/DRESSINGS) ×2 IMPLANT
DRAPE ARM DVNC X/XI (DISPOSABLE) ×8 IMPLANT
DRAPE COLUMN DVNC XI (DISPOSABLE) ×2 IMPLANT
DRAPE DA VINCI XI ARM (DISPOSABLE) ×4
DRAPE DA VINCI XI COLUMN (DISPOSABLE) ×1
DRAPE SHEET LG 3/4 BI-LAMINATE (DRAPES) ×3 IMPLANT
DRAPE SURG IRRIG POUCH 19X23 (DRAPES) ×3 IMPLANT
ELECT REM PT RETURN 15FT ADLT (MISCELLANEOUS) ×3 IMPLANT
GAUZE SPONGE 4X4 16PLY XRAY LF (GAUZE/BANDAGES/DRESSINGS) ×3 IMPLANT
GLOVE BIO SURGEON STRL SZ 6 (GLOVE) ×12 IMPLANT
GLOVE BIO SURGEON STRL SZ 6.5 (GLOVE) ×6 IMPLANT
GOWN STRL REUS W/ TWL LRG LVL3 (GOWN DISPOSABLE) ×4 IMPLANT
GOWN STRL REUS W/TWL LRG LVL3 (GOWN DISPOSABLE) ×2
HOLDER FOLEY CATH W/STRAP (MISCELLANEOUS) IMPLANT
IRRIG SUCT STRYKERFLOW 2 WTIP (MISCELLANEOUS) ×3
IRRIGATION SUCT STRKRFLW 2 WTP (MISCELLANEOUS) ×2 IMPLANT
KIT PROCEDURE DA VINCI SI (MISCELLANEOUS) ×1
KIT PROCEDURE DVNC SI (MISCELLANEOUS) ×2 IMPLANT
KIT TURNOVER KIT A (KITS) IMPLANT
MANIPULATOR UTERINE 4.5 ZUMI (MISCELLANEOUS) ×3 IMPLANT
NEEDLE HYPO 22GX1.5 SAFETY (NEEDLE) ×3 IMPLANT
NEEDLE SPNL 18GX3.5 QUINCKE PK (NEEDLE) ×3 IMPLANT
OBTURATOR OPTICAL STANDARD 8MM (TROCAR) ×1
OBTURATOR OPTICAL STND 8 DVNC (TROCAR) ×2
OBTURATOR OPTICALSTD 8 DVNC (TROCAR) ×2 IMPLANT
PACK ROBOT GYN CUSTOM WL (TRAY / TRAY PROCEDURE) ×3 IMPLANT
PAD POSITIONING PINK XL (MISCELLANEOUS) ×3 IMPLANT
PORT ACCESS TROCAR AIRSEAL 12 (TROCAR) IMPLANT
PORT ACCESS TROCAR AIRSEAL 5M (TROCAR)
POUCH SPECIMEN RETRIEVAL 10MM (ENDOMECHANICALS) IMPLANT
SEAL CANN UNIV 5-8 DVNC XI (MISCELLANEOUS) ×8 IMPLANT
SEAL XI 5MM-8MM UNIVERSAL (MISCELLANEOUS) ×4
SET TRI-LUMEN FLTR TB AIRSEAL (TUBING) IMPLANT
SET TUBE SMOKE EVAC HIGH FLOW (TUBING) ×3 IMPLANT
SURGIFLO W/THROMBIN 8M KIT (HEMOSTASIS) IMPLANT
SUT VIC AB 0 CT1 27 (SUTURE)
SUT VIC AB 0 CT1 27XBRD ANTBC (SUTURE) IMPLANT
SUT VIC AB 2-0 CT1 27 (SUTURE) ×3
SUT VIC AB 2-0 CT1 27XBRD (SUTURE) ×4 IMPLANT
SUT VIC AB 2-0 CT1 TAPERPNT 27 (SUTURE) ×2 IMPLANT
SUT VICRYL 0 27 CT2 27 ABS (SUTURE) ×3 IMPLANT
SYR 10ML LL (SYRINGE) ×3 IMPLANT
TOWEL OR NON WOVEN STRL DISP B (DISPOSABLE) ×3 IMPLANT
TRAP SPECIMEN MUCOUS 40CC (MISCELLANEOUS) IMPLANT
TRAY FOLEY MTR SLVR 16FR STAT (SET/KITS/TRAYS/PACK) ×3 IMPLANT
TROCAR XCEL 12X100 BLDLESS (ENDOMECHANICALS) ×3 IMPLANT
UNDERPAD 30X30 (UNDERPADS AND DIAPERS) ×3 IMPLANT
WATER STERILE IRR 1000ML POUR (IV SOLUTION) ×3 IMPLANT

## 2018-10-10 NOTE — Anesthesia Postprocedure Evaluation (Signed)
Anesthesia Post Note  Patient: Mariah Mcclure  Procedure(s) Performed: XI ROBOTIC ASSISTED TOTAL HYSTERECTOMY WITH BILATERAL SALPINGO OOPHORECTOMY (Bilateral ) SENTINEL  LYMPH NODE BIOPSY (N/A )     Patient location during evaluation: PACU Anesthesia Type: General Level of consciousness: awake and alert Pain management: pain level controlled Vital Signs Assessment: post-procedure vital signs reviewed and stable Respiratory status: spontaneous breathing, nonlabored ventilation, respiratory function stable and patient connected to nasal cannula oxygen Cardiovascular status: blood pressure returned to baseline and stable Postop Assessment: no apparent nausea or vomiting Anesthetic complications: no    Last Vitals:  Vitals:   10/10/18 1315 10/10/18 1330  BP: (!) 144/81 137/77  Pulse: 86 86  Resp: 14 15  Temp:  36.8 C  SpO2: 95% 95%    Last Pain:  Vitals:   10/10/18 1330  TempSrc:   PainSc: 0-No pain                 Aliceson Dolbow

## 2018-10-10 NOTE — Anesthesia Procedure Notes (Signed)
Procedure Name: Intubation Date/Time: 10/10/2018 10:28 AM Performed by: Sharlette Dense, CRNA Patient Re-evaluated:Patient Re-evaluated prior to induction Oxygen Delivery Method: Circle system utilized Preoxygenation: Pre-oxygenation with 100% oxygen Induction Type: IV induction Ventilation: Mask ventilation without difficulty and Oral airway inserted - appropriate to patient size Laryngoscope Size: Mac and 3 Grade View: Grade I Tube type: Oral Tube size: 7.0 mm Number of attempts: 1 Airway Equipment and Method: Stylet Placement Confirmation: ETT inserted through vocal cords under direct vision,  positive ETCO2 and breath sounds checked- equal and bilateral Secured at: 20 cm Tube secured with: Tape Dental Injury: Teeth and Oropharynx as per pre-operative assessment

## 2018-10-10 NOTE — Transfer of Care (Signed)
Immediate Anesthesia Transfer of Care Note  Patient: [name]  Procedure(s) Performed: XI ROBOTIC ASSISTED TOTAL HYSTERECTOMY WITH BILATERAL SALPINGO OOPHORECTOMY (Bilateral ) SENTINEL  LYMPH NODE BIOPSY (N/A )  Patient Location: PACU  Anesthesia Type:General  Level of Consciousness: awake and alert   Airway & Oxygen Therapy: Patient Spontanous Breathing and Patient connected to face mask oxygen  Post-op Assessment: Report given to RN and Post -op Vital signs reviewed and stable  Post vital signs: Reviewed and stable  Last Vitals:  Vitals Value Taken Time  BP    Temp    Pulse 91 10/10/2018 12:41 PM  Resp 11 10/10/2018 12:42 PM  SpO2 100 % 10/10/2018 12:41 PM  Vitals shown include unvalidated device data.  Last Pain:  Vitals:   10/10/18 0837  TempSrc: Oral         Complications: No apparent anesthesia complications

## 2018-10-10 NOTE — Discharge Instructions (Signed)
10/10/2018  Return to work: 4 weeks  Activity: 1. Be up and out of the bed during the day.  Take a nap if needed.  You may walk up steps but be careful and use the hand rail.  Stair climbing will tire you more than you think, you may need to stop part way and rest.   2. No lifting or straining for 6 weeks.  3. No driving for 1 weeks.  Do Not drive if you are taking narcotic pain medicine.  4. Shower daily.  Use soap and water on your incision and pat dry; don't rub.   5. No sexual activity and nothing in the vagina for 8 weeks.  Medications:  - Take ibuprofen and tylenol first line for pain control. Take these regularly (every 6 hours) to decrease the build up of pain.  - If necessary, for severe pain not relieved by ibuprofen, take percocet.  - While taking percocet you should take sennakot every night to reduce the likelihood of constipation. If this causes diarrhea, stop its use.  Diet: 1. Low sodium Heart Healthy Diet is recommended.  2. It is safe to use a laxative if you have difficulty moving your bowels.   Wound Care: 1. Keep clean and dry.  Shower daily.  Reasons to call the Doctor:   Fever - Oral temperature greater than 100.4 degrees Fahrenheit  Foul-smelling vaginal discharge  Difficulty urinating  Nausea and vomiting  Increased pain at the site of the incision that is unrelieved with pain medicine.  Difficulty breathing with or without chest pain  New calf pain especially if only on one side  Sudden, continuing increased vaginal bleeding with or without clots.   Follow-up: 1. See [name] in 3 weeks.  Contacts: For questions or concerns you should contact:  Dr. [name] at [phone] After hours and on week-ends call [phone] and ask to speak to the physician on call for Gynecologic Oncology    Total Laparoscopic Hysterectomy A total laparoscopic hysterectomy is a minimally invasive surgery to remove the uterus and cervix. The  fallopian tubes and ovaries can also be removed (bilateral salpingo-oophorectomy) during this surgery, if necessary. This procedure may be done to treat problems such as:  Noncancerous growths in the uterus (uterine fibroids) that cause symptoms.  A condition that causes the lining of the uterus (endometrium) to grow in other areas (endometriosis).  Problems with pelvic support. This is caused by weakened muscles of the pelvis following vaginal childbirth or menopause.  Cancer of the cervix, ovaries, uterus, or endometrium.  Excessive (dysfunctional) uterine bleeding. This surgery is performed by inserting a thin, lighted tube (laparoscope) and surgical instruments into small incisions in the abdomen. The laparoscope sends images to a monitor. The images help the health care provider perform the procedure. After this procedure, you will no longer be able to have a baby, and you will no longer have a menstrual period. Tell a health care provider about:  Any allergies you have.  All medicines you are taking, including vitamins, herbs, eye drops, creams, and over-the-counter medicines.  Any problems you or family members have had with anesthetic medicines.  Any blood disorders you have.  Any surgeries you have had.  Any medical conditions you have.  Whether you are pregnant or may be pregnant. What are the risks? Generally, this is a safe procedure. However, problems may occur, including:  Infection.  Bleeding.  Blood clots in the legs or lungs.  Allergic reactions to  medicines.  Damage to other structures or organs.  The risk that the surgery may have to be switched to the regular one in which a large incision is made in the abdomen (abdominal hysterectomy). What happens before the procedure? Staying hydrated Follow instructions from your health care provider about hydration, which may include:  Up to 2 hours before the procedure - you may continue to drink clear liquids,  such as water, clear fruit juice, black coffee, and plain tea Eating and drinking restrictions Follow instructions from your health care provider about eating and drinking, which may include:  8 hours before the procedure - stop eating heavy meals or foods such as meat, fried foods, or fatty foods.  6 hours before the procedure - stop eating light meals or foods, such as toast or cereal.  6 hours before the procedure - stop drinking milk or drinks that contain milk.  2 hours before the procedure - stop drinking clear liquids. Medicines  Ask your health care provider about: ? Changing or stopping your regular medicines. This is especially important if you are taking diabetes medicines or blood thinners. ? Taking over-the-counter medicines, vitamins, herbs, and supplements. ? Taking medicines such as aspirin and ibuprofen. These medicines can thin your blood. Do not take these medicines unless your health care provider tells you to take them.  You may be given antibiotic medicine to help prevent infection.  You may be asked to take laxatives.  You may be given medicines to help prevent nausea and vomiting after the procedure. General instructions  Ask your health care provider how your surgical site will be marked or identified.  You may be asked to shower with a germ-killing soap.  Do not use any products that contain nicotine or tobacco, such as cigarettes and e-cigarettes. If you need help quitting, ask your health care provider.  You may have an exam or testing, such as an ultrasound to determine the size and shape of your pelvic organs.  You may have a blood or urine sample taken.  This procedure can affect the way you feel about yourself. Talk with your health care provider about the physical and emotional changes hysterectomy may cause.  Plan to have someone take you home from the hospital or clinic.  Plan to have a responsible adult care for you for at least 24 hours  after you leave the hospital or clinic. This is important. What happens during the procedure?  To lower your risk of infection: ? Your health care team will wash or sanitize their hands. ? Your skin will be washed with soap. ? Hair may be removed from the surgical area.  An IV will be inserted into one of your veins.  You will be given one or more of the following: ? A medicine to help you relax (sedative). ? A medicine to make you fall asleep (general anesthetic).  You will be given antibiotic medicine through your IV.  A tube may be inserted down your throat to help you breathe during the procedure.  A gas (carbon dioxide) will be used to inflate your abdomen to allow your surgeon to see inside of your abdomen.  Three or four small incisions will be made in your abdomen.  A laparoscope will be inserted into one of your incisions. Surgical instruments will be inserted through the other incisions in order to perform the procedure.  Your uterus and cervix may be removed through your vagina or cut into small pieces and removed through  the small incisions. Any other organs that need to be removed will also be removed this way.  Carbon dioxide will be released from inside of your abdomen.  Your incisions will be closed with stitches (sutures).  A bandage (dressing) may be placed over your incisions. The procedure may vary among health care providers and hospitals. What happens after the procedure?  Your blood pressure, heart rate, breathing rate, and blood oxygen level will be monitored until the medicines you were given have worn off.  You will be given medicine for pain and nausea as needed.  Do not drive for 24 hours if you received a sedative. Summary  Total Laparoscopic hysterectomy is a procedure to remove your uterus, cervix and sometimes the fallopian tubes and ovaries.  This procedure can affect the way you feel about yourself. Talk with your health care provider about  the physical and emotional changes hysterectomy may cause.  After this procedure, you will no longer be able to have a baby, and you will no longer have a menstrual period.  You will be given pain medicine to control discomfort after this procedure. This information is not intended to replace advice given to you by your health care provider. Make sure you discuss any questions you have with your health care provider. Document Released: 05/14/2007 Document Revised: 09/27/2016 Document Reviewed: 09/27/2016 Elsevier Interactive Patient Education  2019 [employer].

## 2018-10-10 NOTE — Op Note (Signed)
OPERATIVE NOTE 10/10/18  Surgeon: Donaciano Eva   Assistants: Dr Lahoma Crocker (an MD assistant was necessary for tissue manipulation, management of robotic instrumentation, retraction and positioning due to the complexity of the case and hospital policies).   Anesthesia: General endotracheal anesthesia  ASA Class: 3   Pre-operative Diagnosis: endometrial cancer grade  3  Post-operative Diagnosis: same,   Operation: Robotic-assisted laparoscopic total hysterectomy with bilateral salpingoophorectomy, SLN biopsy   Surgeon: Donaciano Eva  Assistant Surgeon: Lahoma Crocker MD  Anesthesia: GET  Urine Output: 300cc  Operative Findings:  : 6cm uterus, normal appearing tubes and ovaries, bilateral mapping  Estimated Blood Loss:  less than 50 mL      Total IV Fluids: 700 ml         Specimens: uterus, cervix, bilateral tubes and ovaries, right obturator SLN, right external iliac SLN, left obturator SLN, left external iliac SLN, mid presacral SLN, right presacral SLN.          Complications:  None; patient tolerated the procedure well.         Disposition: PACU - hemodynamically stable.  Procedure Details  The patient was seen in the Holding Room. The risks, benefits, complications, treatment options, and expected outcomes were discussed with the patient.  The patient concurred with the proposed plan, giving informed consent.  The site of surgery properly noted/marked. The patient was identified as Mariah Mcclure and the procedure verified as a Robotic-assisted hysterectomy with bilateral salpingo oophorectomy with SLN biopsy. A Time Out was held and the above information confirmed.  After induction of anesthesia, the patient was draped and prepped in the usual sterile manner. Pt was placed in supine position after anesthesia and draped and prepped in the usual sterile manner. The abdominal drape was placed after the CholoraPrep had been allowed to dry for 3  minutes.  Her arms were tucked to her side with all appropriate precautions.  The shoulders were stabilized with padded shoulder blocks applied to the acromium processes.  The patient was placed in the semi-lithotomy position in [place].  The perineum was prepped with Betadine. The patient was then prepped. Foley catheter was placed.  A sterile speculum was placed in the vagina.  The cervix was grasped with a single-tooth tenaculum. 2mg  total of ICG was injected into the cervical stroma at 2 and 9 o'clock with 1cc injected at a 1cm and 44mm depth (concentration 0.5mg /ml) in all locations. The cervix was dilated with Kennon Rounds dilators.  The ZUMI uterine manipulator with a medium colpotomizer ring was placed without difficulty.  A pneum occluder balloon was placed over the manipulator.  OG tube placement was confirmed and to suction.   Next, a 5 mm skin incision was made 1 cm below the subcostal margin in the midclavicular line.  The 5 mm Optiview port and scope was used for direct entry.  Opening pressure was under 10 mm CO2.  The abdomen was insufflated and the findings were noted as above.   At this point and all points during the procedure, the patient's intra-abdominal pressure did not exceed 15 mmHg. Next, a 10 mm skin incision was made 2cm above the umbilicus and a right and left port was placed about 10 cm lateral to the robot port on the right and left side.  A fourth arm was placed in the left lower quadrant 2 cm above and superior and medial to the anterior superior iliac spine.  All ports were placed under direct visualization.  The  patient was placed in steep Trendelenburg.  Bowel was folded away into the upper abdomen.  The robot was docked in the normal manner.  The right and left peritoneum were opened parallel to the IP ligament to open the retroperitoneal spaces bilaterally. The SLN mapping was performed in bilateral pelvic basins. The para rectal and paravesical spaces were opened up entirely  with careful dissection below the level of the ureters bilaterally and to the depth of the uterine artery origin in order to skeletonize the uterine "web" and ensure visualization of all parametrial channels. The para-aortic basins were carefully exposed and evaluated for isolated para-aortic SLN's. Lymphatic channels were identified travelling to the following visualized sentinel lymph node's: right and left obturator and external iliac SLN's. THere was a channel from the left to a midsacral SLN and a channel from the right . These SLN's were separated from their surrounding lymphatic tissue, removed and sent for permanent pathology.  The hysterectomy was started after the round ligament on the right side was incised and the retroperitoneum was entered and the pararectal space was developed.  The ureter was noted to be on the medial leaf of the broad ligament.  The peritoneum above the ureter was incised and stretched and the infundibulopelvic ligament was skeletonized, cauterized and cut.  The posterior peritoneum was taken down to the level of the KOH ring.  The anterior peritoneum was also taken down.  The bladder flap was created to the level of the KOH ring.  The uterine artery on the right side was skeletonized, cauterized and cut in the normal manner.  A similar procedure was performed on the left.  The colpotomy was made and the uterus, cervix, bilateral ovaries and tubes were amputated and delivered through the vagina.  Pedicles were inspected and excellent hemostasis was achieved.    The colpotomy at the vaginal cuff was closed with Vicryl on a CT1 needle in a running manner.  Irrigation was used and excellent hemostasis was achieved.  At this point in the procedure was completed.  Robotic instruments were removed under direct visulaization.  The robot was undocked. The 10 mm ports were closed with Vicryl on a UR-5 needle and the fascia was closed with 0 Vicryl on a UR-5 needle.  The skin was closed  with 4-0 Vicryl in a subcuticular manner.  Dermabond was applied.  Sponge, lap and needle counts correct x 2.   The vagina was swabbed with bleeding noted which was made hemostatic with suture at the midline and right sidewall.   All instrument and needle counts were correct x  3.   The patient was transferred to the recovery room in a stable condition.  Donaciano Eva, MD

## 2018-10-10 NOTE — Anesthesia Procedure Notes (Signed)
Performed by: Aron Needles L, CRNA       

## 2018-10-10 NOTE — Interval H&P Note (Signed)
History and Physical Interval Note:  10/10/2018 10:10 AM  Mariah Mcclure  has presented today for surgery, with the diagnosis of ENDOMETRIAL CANCER.  The various methods of treatment have been discussed with the patient and family. After consideration of risks, benefits and other options for treatment, the patient has consented to  Procedure(s): XI ROBOTIC ASSISTED TOTAL HYSTERECTOMY WITH BILATERAL SALPINGO OOPHORECTOMY (Bilateral) SENTINEL  LYMPH NODE BIOPSY (N/A) as a surgical intervention.  The patient's history has been reviewed, patient examined, no change in status, stable for surgery.  I have reviewed the patient's chart and labs.  Questions were answered to the patient's satisfaction.     Thereasa Solo

## 2018-10-11 ENCOUNTER — Telehealth: Payer: Self-pay

## 2018-10-11 ENCOUNTER — Encounter (HOSPITAL_COMMUNITY): Payer: Self-pay | Admitting: Gynecologic Oncology

## 2018-10-11 NOTE — Telephone Encounter (Signed)
Outgoing call to pt's sister Alvis Lemmings per Joylene John NP to see how she is doing, does she need script for glucose meter?  No answer, left VM to call our office.

## 2018-10-14 ENCOUNTER — Other Ambulatory Visit: Payer: Self-pay | Admitting: Gynecologic Oncology

## 2018-10-14 ENCOUNTER — Telehealth: Payer: Self-pay

## 2018-10-14 DIAGNOSIS — E119 Type 2 diabetes mellitus without complications: Secondary | ICD-10-CM

## 2018-10-14 MED ORDER — GLUCOSE BLOOD VI STRP: ORAL_STRIP | 1 refills | Status: DC

## 2018-10-14 MED ORDER — ACCU-CHEK SOFTCLIX LANCETS MISC: 1 refills | Status: AC

## 2018-10-14 MED ORDER — ACCU-CHEK AVIVA DEVI: 0 refills | Status: AC

## 2018-10-14 NOTE — Telephone Encounter (Signed)
Mariah Mcclure states that her sister is doing great. She is afebrile. She has a productive cough with white phlegm.  Encouraged Greta to have her sister do  cough and deep breathing exercises. Greta not able to get a glucose test meter or strips from family. She would like Joylene John, NP sent in a meter and test strips to [facility] on [address].

## 2018-10-14 NOTE — Telephone Encounter (Signed)
Checked with Pharmacist and glucose meter, lancets, and strips are covered by her insurance.  [facility] to notify sister Alvis Lemmings that these products are ready to pick up.

## 2018-10-14 NOTE — Progress Notes (Signed)
Glucose meter per pt/sister request

## 2018-10-15 ENCOUNTER — Encounter: Payer: Self-pay | Admitting: Oncology

## 2018-10-15 DIAGNOSIS — C541 Malignant neoplasm of endometrium: Secondary | ICD-10-CM

## 2018-10-17 ENCOUNTER — Telehealth: Payer: Self-pay | Admitting: Gynecologic Oncology

## 2018-10-17 ENCOUNTER — Encounter: Payer: Self-pay | Admitting: Gynecologic Oncology

## 2018-10-17 NOTE — Telephone Encounter (Signed)
Informed sister of path results along with Dr. Serita Grit recommendations for vaginal brachytherapy.  All questions answered.  Patient is doing well post-operatively.  Referral will be placed.  Advised to call for any needs or concerns.

## 2018-10-23 ENCOUNTER — Ambulatory Visit: Payer: Medicare Other | Admitting: Gynecologic Oncology

## 2018-11-11 ENCOUNTER — Telehealth: Payer: Self-pay | Admitting: *Deleted

## 2018-11-11 NOTE — Telephone Encounter (Signed)
Patient's sister called back, prescreening done with her. The patient has no signs/symptoms, has not traveled, or had an exposure. The patient has not been in contact with anyone sick, or has traveled or had an exposure. Explained the she will be asked these again at the front door along with a temperature check. Explained the no policy. The sister stated "she is developmental delayed and will need help." Explained that she can drop the patient off at the front door and someone from the cancer center will be with her at all times. Explained that we will call her in room at the time of her appt with Dr. Denman George.

## 2018-11-11 NOTE — Telephone Encounter (Signed)
Called both the patient and her sister, left a message for them to call the office back. The patient needs to be prescreened for her appt on 4/15

## 2018-11-13 ENCOUNTER — Inpatient Hospital Stay: Payer: Medicare Other | Attending: Gynecologic Oncology

## 2018-11-13 ENCOUNTER — Inpatient Hospital Stay: Payer: Medicare Other | Admitting: Genetic Counselor

## 2018-11-13 ENCOUNTER — Encounter: Payer: Self-pay | Admitting: Genetic Counselor

## 2018-11-13 ENCOUNTER — Inpatient Hospital Stay (HOSPITAL_BASED_OUTPATIENT_CLINIC_OR_DEPARTMENT_OTHER): Payer: Medicare Other | Admitting: Gynecologic Oncology

## 2018-11-13 ENCOUNTER — Inpatient Hospital Stay: Payer: Medicare Other

## 2018-11-13 ENCOUNTER — Other Ambulatory Visit: Payer: Self-pay

## 2018-11-13 VITALS — BP 147/91 | HR 94 | Temp 98.0°F | Resp 18 | Ht <= 58 in | Wt 142.0 lb

## 2018-11-13 DIAGNOSIS — Z90710 Acquired absence of both cervix and uterus: Secondary | ICD-10-CM

## 2018-11-13 DIAGNOSIS — C541 Malignant neoplasm of endometrium: Secondary | ICD-10-CM

## 2018-11-13 DIAGNOSIS — Z800 Family history of malignant neoplasm of digestive organs: Secondary | ICD-10-CM | POA: Insufficient documentation

## 2018-11-13 DIAGNOSIS — Z90722 Acquired absence of ovaries, bilateral: Secondary | ICD-10-CM | POA: Insufficient documentation

## 2018-11-13 DIAGNOSIS — Z803 Family history of malignant neoplasm of breast: Secondary | ICD-10-CM

## 2018-11-13 DIAGNOSIS — Z7189 Other specified counseling: Secondary | ICD-10-CM

## 2018-11-13 NOTE — Patient Instructions (Signed)
Please notify Dr Denman George at phone number [phone] if you notice vaginal bleeding, new pelvic or abdominal pains, bloating, feeling full easy, or a change in bladder or bowel function.   Please contact Dr Serita Grit office (at [phone]) in or after July, 2020 to request an appointment with her for September, 2020 (or Dr Clabe Seal office will help schedule this for you 3 months after your last appointment with him).  Dr Serita Grit office will help provide your medical records for you when you make plans to travel to [place] for transfer of care.  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

## 2018-11-13 NOTE — Progress Notes (Signed)
Follow-up Note: Gyn-Onc  Consult was initially requested by Dr. Garwin Brothers for the evaluation of Mariah Mcclure 53 y.o. female  CC:  Chief Complaint  Patient presents with  . Endometrial cancer [facility])    Assessment/Plan:  [name]  is a 53 y.o.  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  High/intermediate risk factors for recurrence. Recommendation is for vaginal brachytherapy to reduce risk for local recurrence in accordance with NCCN guidelines.  I discussed this with the patient. I discussed the role of adjuvant therapy. I discussed prognosis and risk for recurrence. We reviewed symptoms concerning for recurrence and she will see me if these develop prior to her scheduled appointment.  After completing adjuvant therapy I recommend she follow-up at 3 monthly intervals for symptom review, physical examination and pelvic examination. Pap smear is not recommended in routine endometrial cancer surveillance. After 2 years we will space these visits to every 6 months, and then annually if recurrence has not developed within 5 years. All questions were answered.  She is interested in returning to [place] for follow-up after she has completed her care and initial follow-up. We will help her find somebody to transition care with.  Due to her significant family history we will schedule her to see genetics postoperatively.    HPI: [name] is a 53 year old P0 who is seen in consultation at the request of Dr. cousins for a grade 3 endometrial cancer.  The patient has possibly transition through menopause approximately 2 years ago, although is unclear if she is postmenopausal or not as she is always had a history of abnormal uterine bleeding.  The patient has MR and developmental delay delay and her primary caregivers are her sister in [place] and her brother in [place].  Her sister reports that she developed left lower  quadrant pain around Thanksgiving of 2019.  In December 2019 she began reporting heavy vaginal bleeding which became so persistent in January 2020 that she became anemic and required 2 unit packed cell blood transfusion.  She was then taken to see Dr. cousins for further evaluation and Dr. cousins performed a transvaginal ultrasound scan on August 20, 2018 which revealed a uterus measuring 8.5 x 4.8 x 5.3 cm with an endometrial thickness of 25 mm.  This was followed up by a sonohysterogram which revealed endometrial wall thickening within the fundal portion measuring 3 cm with hypervascularity.  She then underwent a D&C in the operating room on September 16, 2018 this was a diagnostic hysteroscopy, hysteroscopic resection of the endometrial mass and D&C.  The procedure was uncomplicated.  The final pathology revealed a high-grade endometrial carcinoma, with a differential diagnosis including high-grade endometrioid and a mixed endometrioid serous carcinoma.  P53 stain was normal.  P 16 did not show significant overexpression.  The patient has some intellectual impairment from developmental delay.  She is has a seizure disorder though this included on a singular grand mal seizure approximately 30 years ago.  She takes Tegretol for this.  She has type 2 diabetes mellitus which is controlled with metformin.  Her only prior abdominal surgery was a laparoscopic cholecystectomy.  Her family history is remarkable for a maternal aunt who has a history of endometrial cancer a maternal aunt with breast cancer maternal aunt with pancreatic cancer and a maternal uncle with lung cancer who was a non-smoker but possibly had environmental exposure to coal.  Her father had a diagnosis of prostate  cancer.  Interval Hx:  On 10/10/18 she underwent a robotic assisted total hysterectomy, BSO and SLN biopsy. Final pathology revealed a FIGO grade 3 endometrioid adenocarcinoma, p53 wild-type.  It was minimally invasive into the  endometrium invading 3 of 21 mm thickness.  LVSI was present.  6 sentinel lymph nodes were all negative for metastatic carcinoma as were the adnexa and cervix.  Final staging was a stage Ia grade 3 endometrioid adenocarcinoma. She was determined to have high/intermediate risk factors for recurrence and therefore vaginal brachytherapy was recommended in accordance with NCCN guidelines.   Postoperatively she has done well with no complaints.   Current Meds:  Outpatient Encounter Medications as of 11/13/2018  Medication Sig  . Accu-Chek Softclix Lancets lancets E 11.9, check blood sugar once daily  . Blood Glucose Monitoring Suppl (ACCU-CHEK AVIVA) device E 11.9 Use as instructed  . carbamazepine (TEGRETOL XR) 400 MG 12 hr tablet Take 400 mg by mouth 2 (two) times daily.   Marland Kitchen glucose blood test strip E11.9, for once daily checks  . ibuprofen (ADVIL,MOTRIN) 800 MG tablet Take 1 tablet (800 mg total) by mouth every 8 (eight) hours as needed for moderate pain.  . metFORMIN (GLUCOPHAGE) 500 MG tablet Take 500 mg by mouth 2 (two) times daily with a meal.   . rosuvastatin (CRESTOR) 20 MG tablet Take 20 mg by mouth daily.  . [DISCONTINUED] oxyCODONE (OXY IR/ROXICODONE) 5 MG immediate release tablet Take 1 tablet (5 mg total) by mouth every 4 (four) hours as needed for severe pain. For AFTER surgery, Do not take and drive  . [DISCONTINUED] senna-docusate (SENOKOT-S) 8.6-50 MG tablet Take 2 tablets by mouth at bedtime. For AFTER surgery, hold if having loose stools   No facility-administered encounter medications on file as of 11/13/2018.     Allergy: No Known Allergies  Social Hx:   Social History   Socioeconomic History  . Marital status: Single    Spouse name: Not on file  . Number of children: Not on file  . Years of education: Not on file  . Highest education level: Not on file  Occupational History  . Not on file  Social Needs  . Financial resource strain: Not on file  . Food insecurity:     Worry: Not on file    Inability: Not on file  . Transportation needs:    Medical: Not on file    Non-medical: Not on file  Tobacco Use  . Smoking status: Never Smoker  . Smokeless tobacco: Never Used  Substance and Sexual Activity  . Alcohol use: Never    Frequency: Never  . Drug use: Never  . Sexual activity: Not on file  Lifestyle  . Physical activity:    Days per week: Not on file    Minutes per session: Not on file  . Stress: Not on file  Relationships  . Social connections:    Talks on phone: Not on file    Gets together: Not on file    Attends religious service: Not on file    Active member of club or organization: Not on file    Attends meetings of clubs or organizations: Not on file    Relationship status: Not on file  . Intimate partner violence:    Fear of current or ex partner: Not on file    Emotionally abused: Not on file    Physically abused: Not on file    Forced sexual activity: Not on file  Other Topics Concern  .  Not on file  Social History Narrative  . Not on file    Past Surgical Hx:  Past Surgical History:  Procedure Laterality Date  . CHOLECYSTECTOMY  20 yrs ago  . DILATATION & CURETTAGE/HYSTEROSCOPY WITH MYOSURE N/A 09/16/2018   Procedure: DILATATION & CURETTAGE/HYSTEROSCOPY WITH MYOSURE;  Surgeon: Servando Salina, MD;  Location: [place];  Service: Gynecology;  Laterality: N/A;  . EYE SURGERY     Patient was 53 years old.  Marland Kitchen LYMPH NODE BIOPSY N/A 10/10/2018   Procedure: SENTINEL  LYMPH NODE BIOPSY;  Surgeon: [name], MD;  Location: [facility];  Service: Gynecology;  Laterality: N/A;  . ROBOTIC ASSISTED TOTAL HYSTERECTOMY WITH BILATERAL SALPINGO OOPHERECTOMY Bilateral 10/10/2018   Procedure: XI ROBOTIC ASSISTED TOTAL HYSTERECTOMY WITH BILATERAL SALPINGO OOPHORECTOMY;  Surgeon: [name], MD;  Location: [facility];  Service: Gynecology;  Laterality: Bilateral;    Past Medical Hx:  Past Medical History:  Diagnosis Date  .  Abnormal perimenopausal bleeding   . Anemia    due to bleeding  . Developmental delay   . Diabetes mellitus without complication ([place])    type 2  . Heart murmur   . Hepatitis 1990's    Patient states that she has hep A yrs ago took tx  . Seizure ([place]) 30 yrs ago    Past Gynecological History:  G0 No LMP recorded.  Family Hx:  Family History  Problem Relation Age of Onset  . Stroke Mother   . Prostate cancer Father   . Breast cancer Maternal Aunt   . Lung cancer Maternal Uncle   . Endometrial cancer Maternal Aunt   . Stroke Maternal Aunt   . Pancreatic cancer Maternal Aunt     Review of Systems:  Constitutional  Feels well,    ENT Normal appearing ears and nares bilaterally Skin/Breast  No rash, sores, jaundice, itching, dryness Cardiovascular  No chest pain, shortness of breath, or edema  Pulmonary  No cough or wheeze.  Gastro Intestinal  No nausea, vomitting, or diarrhoea. No bright red blood per rectum, no abdominal pain, change in bowel movement, or constipation.  Genito Urinary  No frequency, urgency, dysuria, no bleeding Musculo Skeletal  No myalgia, arthralgia, joint swelling or pain  Neurologic  No weakness, numbness, change in gait,  Psychology  No depression, anxiety, insomnia.   Vitals:  Blood pressure (!) 147/91, pulse 94, temperature 98 F (36.7 C), temperature source Oral, resp. rate 18, height _0  (1.473 m), weight 142 lb (64.4 kg), SpO2 100 %.  Physical Exam: WD in NAD Neck  Supple NROM, without any enlargements.  Lymph Node Survey No cervical supraclavicular or inguinal adenopathy Cardiovascular  Pulse normal rate, regularity and rhythm. S1 and S2 normal.  Lungs  Clear to auscultation bilateraly, without wheezes/crackles/rhonchi. Good air movement.  Skin  No rash/lesions/breakdown  Psychiatry  Alert and oriented to person, place, and time  Abdomen  Normoactive bowel sounds, abdomen soft, non-tender and nonobese without evidence of  hernia. Incisions well healed.  Back No CVA tenderness Genito Urinary  Vaginal cuff well healed. Rectal  deferred Extremities  No bilateral cyanosis, clubbing or edema.   30 minutes of direct face to face counseling time was spent with the patient. This included discussion about prognosis, therapy recommendations and postoperative side effects and are beyond the scope of routine postoperative care.   Thereasa Solo, MD  11/13/2018, 1:48 PM

## 2018-11-13 NOTE — Progress Notes (Signed)
REFERRING PROVIDER: [name], MD [place], [place] [address]  PRIMARY PROVIDER:  System, Pcp Not In  PRIMARY REASON FOR VISIT:  1. Endometrial cancer ([place])   2. Family history of pancreatic cancer   3. Family history of breast cancer      HISTORY OF PRESENT ILLNESS:  I connected with Mariah Mcclure and her sister Mariah Mcclure on 11/14/2018 at 3:00 PM EDT by Webex video conference and verified that I am speaking with the correct person using two identifiers.   Patient location: Home Provider location: Office   Mariah Mcclure, a 53 y.o. female, was seen for a [place] cancer genetics consultation at the request of Dr. Denman George due to a personal and family history of cancer.  Mariah Mcclure presents to clinic today to discuss the possibility of a hereditary predisposition to cancer, genetic testing, and to further clarify her future cancer risks, as well as potential cancer risks for family members.   In January 2020, at the age of 74, Mariah Mcclure was diagnosed with cancer of the endometrium. The treatment plan is surgery which was performed in March.  MSI/IHC was not performed, but the patient was referred due to personal and family history of cancer.    CANCER HISTORY:   No history exists.     RISK FACTORS:  Menarche was at age 12.  First live birth at age N/A.  OCP use for approximately 1 years.  Ovaries intact: no.  Hysterectomy: yes.  Menopausal status: postmenopausal.  HRT use: 0 years. Colonoscopy: no; not examined. Mammogram within the last year: yes. Number of breast biopsies: 0. Up to date with pelvic exams: yes. Any excessive radiation exposure in the past: no  Past Medical History:  Diagnosis Date  . Abnormal perimenopausal bleeding   . Anemia    due to bleeding  . Developmental delay   . Diabetes mellitus without complication ([place])    type 2  . Family history of breast cancer   . Family history of pancreatic cancer   . Heart murmur   . Hepatitis  1990's    Patient states that she has hep A yrs ago took tx  . Seizure ([place]) 30 yrs ago    Past Surgical History:  Procedure Laterality Date  . CHOLECYSTECTOMY  20 yrs ago  . DILATATION & CURETTAGE/HYSTEROSCOPY WITH MYOSURE N/A 09/16/2018   Procedure: DILATATION & CURETTAGE/HYSTEROSCOPY WITH MYOSURE;  Surgeon: Servando Salina, MD;  Location: [place];  Service: Gynecology;  Laterality: N/A;  . EYE SURGERY     Patient was 53 years old.  Marland Kitchen LYMPH NODE BIOPSY N/A 10/10/2018   Procedure: SENTINEL  LYMPH NODE BIOPSY;  Surgeon: [name], MD;  [facility];  Service: Gynecology;  Laterality: N/A;  . ROBOTIC ASSISTED TOTAL HYSTERECTOMY WITH BILATERAL SALPINGO OOPHERECTOMY Bilateral 10/10/2018   Procedure: XI ROBOTIC ASSISTED TOTAL HYSTERECTOMY WITH BILATERAL SALPINGO OOPHORECTOMY;  Surgeon: [name], MD;  [facility];  Service: Gynecology;  Laterality: Bilateral;    Social History   Socioeconomic History  . Marital status: Single    Spouse name: Not on file  . Number of children: Not on file  . Years of education: Not on file  . Highest education level: Not on file  Occupational History  . Not on file  Social Needs  . Financial resource strain: Not on file  . Food insecurity:    Worry: Not on file    Inability: Not on file  . Transportation needs:  Medical: Not on file    Non-medical: Not on file  Tobacco Use  . Smoking status: Never Smoker  . Smokeless tobacco: Never Used  Substance and Sexual Activity  . Alcohol use: Never    Frequency: Never  . Drug use: Never  . Sexual activity: Not on file  Lifestyle  . Physical activity:    Days per week: Not on file    Minutes per session: Not on file  . Stress: Not on file  Relationships  . Social connections:    Talks on phone: Not on file    Gets together: Not on file    Attends religious service: Not on file    Active member of club or organization: Not on file    Attends meetings of clubs or  organizations: Not on file    Relationship status: Not on file  Other Topics Concern  . Not on file  Social History Narrative  . Not on file     FAMILY HISTORY:  We obtained a detailed, 4-generation family history.  Significant diagnoses are listed below: Family History  Problem Relation Age of Onset  . Stroke Mother   . Prostate cancer Father   . Breast cancer Maternal Aunt   . Lung cancer Maternal Uncle   . Endometrial cancer Maternal Aunt   . Stroke Maternal Aunt   . Pancreatic cancer Maternal Aunt     The patient does not have children.  She has two full brothers and a full sister, a maternal half brother and two paternal half sisters.  None of her siblings have cancer.  Both of the patient's parents are deceased.  The patient's mother died of a combination of strokes and sepsis.  She had five brothers and four sisters.  One brother had lung cancer, one sister had cervical cancer, a second sister had leukemia and then pancreatic cancer at 63, a third sister had an unknown 'reproductive' cancer at 46 and the fourth sister had breast cancer at 69.  The maternal grandparents are deceased from non cancer related issues.  The patient's father left the family 33 years ago, and there has been little contact.  What is known is that he had prostate cancer around age 3 and died at 72.  He had at least two daughters who the family knows about but have not had contact with.  He had one brother with prostate cancer.  Mariah Mcclure is unaware of previous family history of genetic testing for hereditary cancer risks. Patient's maternal ancestors are of [place] descent, and paternal ancestors are of [place] descent. There is possible reported Ashkenazi Jewish ancestry. There is no known consanguinity.  GENETIC COUNSELING ASSESSMENT: Mariah Mcclure is a 53 y.o. female with a personal and family history of cancer which is somewhat suggestive of a hereditary cancer syndrome, such as Lynch syndrome, and  predisposition to cancer. We, therefore, discussed and recommended the following at today's visit.   DISCUSSION: We discussed that 5 - 10% of cancer is hereditary.  Based on the personal and family history of cancer, we are most suspicious of Lynch syndrome.  There are other genes that can be associated with hereditary uterine cancer syndromes.  Based on the ages of onset of cancer in the family it is most suspicious that this is not a hereditary syndrome, but instead a familial cancer, as all cancers are occurring at older ages that are more typically associated with cancer.  At times, we can screen for Lynch syndrome by testing  the tumor for MSI and IHC loss,  This was not performed at this time. We discussed that testing is beneficial for several reasons including knowing how to follow individuals after completing their treatment, and understanding if other family members could be at risk for cancer and allow them to undergo genetic testing.   We reviewed the characteristics, features and inheritance patterns of hereditary cancer syndromes. We also discussed genetic testing, including the appropriate family members to test, the process of testing, insurance coverage and turn-around-time for results. We discussed the implications of a negative, positive and/or variant of uncertain significant result. We recommended Mariah Mcclure pursue genetic testing for the common hereditary cancer gene panel. The Common Hereditary Gene Panel offered by [facility] includes sequencing and/or deletion duplication testing of the following 48 genes: APC, ATM, AXIN2, BARD1, BMPR1A, BRCA1, BRCA2, BRIP1, CDH1, CDK4, CDKN2A (p14ARF), CDKN2A (p16INK4a), CHEK2, CTNNA1, DICER1, EPCAM (Deletion/duplication testing only), GREM1 (promoter region deletion/duplication testing only), KIT, MEN1, MLH1, MSH2, MSH3, MSH6, MUTYH, NBN, NF1, NHTL1, PALB2, PDGFRA, PMS2, POLD1, POLE, PTEN, RAD50, RAD51C, RAD51D, RNF43, SDHB, SDHC, SDHD, SMAD4, SMARCA4.  STK11, TP53, TSC1, TSC2, and VHL.  The following genes were evaluated for sequence changes only: SDHA and HOXB13 c.251G>A variant only.   There were several check ins with Mariah Mcclure to determine whether she understood what was happening.  She stated she did understand, and her sister and caretaker also agreed that they both understood this conversation.  Based on Mariah Mcclure's personal and family history of cancer, she meets medical criteria for genetic testing. Despite that she meets criteria, she may still have an out of pocket cost. We discussed that if her out of pocket cost for testing is over $100, the laboratory will call and confirm whether she wants to proceed with testing.  If the out of pocket cost of testing is less than $100 she will be billed by the genetic testing laboratory.   PLAN: After considering the risks, benefits, and limitations, Mariah Mcclure provided informed consent to pursue genetic testing and the blood sample was sent to [facility] for analysis of the Common hereditary cancer panel. Results should be available within approximately 2-3 weeks' time, at which point they will be disclosed by telephone to Mariah Mcclure, as will any additional recommendations warranted by these results. Mariah Mcclure will receive a summary of her genetic counseling visit and a copy of her results once available. This information will also be available in Epic.   Lastly, we encouraged Mariah Mcclure to remain in contact with cancer genetics annually so that we can continuously update the family history and inform her of any changes in cancer genetics and testing that may be of benefit for this family.   Ms. Stephens questions, as well as her sisters, were answered to her satisfaction today. Our contact information was provided should additional questions or concerns arise. Thank you for the referral and allowing [place] to share in the care of your patient.   Karen P. Florene Glen, [place], [facility] Certified  Genetic Counselor [email or web address] phone: [phone]  The patient was seen for a total of 52 minutes in face-to-face genetic counseling.  This patient was discussed with Drs. Magrinat, Lindi Adie and/or Burr Medico who agrees with the above.    _______________________________________________________________________ For Office Staff:  Number of people involved in session: 2 Was an Intern/ student involved with case: yes: [employer]

## 2018-11-25 ENCOUNTER — Ambulatory Visit
Admission: RE | Admit: 2018-11-25 | Discharge: 2018-11-25 | Disposition: A | Discharge status: Home / Self Care | Payer: Medicare Other | Source: Ambulatory Visit | Attending: Radiation Oncology | Admitting: Radiation Oncology

## 2018-11-25 ENCOUNTER — Encounter: Payer: Self-pay | Admitting: Radiation Oncology

## 2018-11-25 ENCOUNTER — Other Ambulatory Visit: Payer: Self-pay

## 2018-11-25 VITALS — BP 149/89 | HR 89 | Temp 97.8°F | Resp 18 | Ht <= 58 in | Wt 146.2 lb

## 2018-11-25 DIAGNOSIS — Z90710 Acquired absence of both cervix and uterus: Secondary | ICD-10-CM | POA: Insufficient documentation

## 2018-11-25 DIAGNOSIS — E119 Type 2 diabetes mellitus without complications: Secondary | ICD-10-CM | POA: Diagnosis not present

## 2018-11-25 DIAGNOSIS — Z8669 Personal history of other diseases of the nervous system and sense organs: Secondary | ICD-10-CM | POA: Diagnosis not present

## 2018-11-25 DIAGNOSIS — Z90722 Acquired absence of ovaries, bilateral: Secondary | ICD-10-CM | POA: Diagnosis not present

## 2018-11-25 DIAGNOSIS — Z803 Family history of malignant neoplasm of breast: Secondary | ICD-10-CM | POA: Diagnosis not present

## 2018-11-25 DIAGNOSIS — R011 Cardiac murmur, unspecified: Secondary | ICD-10-CM | POA: Insufficient documentation

## 2018-11-25 DIAGNOSIS — Z7984 Long term (current) use of oral hypoglycemic drugs: Secondary | ICD-10-CM | POA: Insufficient documentation

## 2018-11-25 DIAGNOSIS — C541 Malignant neoplasm of endometrium: Secondary | ICD-10-CM

## 2018-11-25 DIAGNOSIS — Z800 Family history of malignant neoplasm of digestive organs: Secondary | ICD-10-CM | POA: Insufficient documentation

## 2018-11-25 DIAGNOSIS — Z79899 Other long term (current) drug therapy: Secondary | ICD-10-CM | POA: Insufficient documentation

## 2018-11-25 DIAGNOSIS — D649 Anemia, unspecified: Secondary | ICD-10-CM | POA: Diagnosis not present

## 2018-11-25 NOTE — Progress Notes (Signed)
Radiation Oncology         [phone] ________________________________  Initial Outpatient Consultation  Name: Mariah Mcclure MRN: [id number]  Date: 11/25/2018  DOB: 18-Jan-1966  OB:SJGGEZ, Pcp Not In  [name], MD   REFERRING PHYSICIAN: [name], MD  DIAGNOSIS: The encounter diagnosis was Endometrial cancer [facility]). stageIAgrade 3  endometrial adenocarcinoma, p53 normal wild typeexpression pattern  HISTORY OF PRESENT ILLNESS::Mariah Mcclure is a 53 y.o. female who is presenting to the office today for evaluation of endometrial adenocarcinoma. The patient has possibly transition through menopause approximately 2 years ago, although is unclear if she is postmenopausal or not as she is always had a history of abnormal uterine bleeding. The patient has MR and developmental delay delay and her primary caregivers are her sister in [place] and her brother in [place]. Her sister reports that she developed left lower quadrant pain around Thanksgiving of 2019. In December 2019 she began reporting heavy vaginal bleeding which became so persistent in January 2020 that she became anemic and required 2 unit packed cell blood transfusion.  She was then taken to see Dr. Garwin Brothers for further evaluation and Dr. Garwin Brothers performed a transvaginal ultrasound scan on August 20, 2018 which revealed a uterus measuring 8.5 x 4.8 x 5.3 cm with an endometrial thickness of 25 mm. This was followed up by a sonohysterogram which revealed endometrial wall thickening within the fundal portion measuring 3 cm with hypervascularity. She then underwent a D&C in the operating room on September 16, 2018 this was a diagnostic hysteroscopy, hysteroscopic resection of the endometrial mass and D&C. The procedure was uncomplicated. The final pathology revealed a high-grade endometrial carcinoma, with a differential diagnosis including high-grade endometrioid and a mixed endometrioid serous carcinoma. P53 stain, wild type  normal expression. P 16 did not show significant overexpression.  She underwent robotic-assisted laparoscopic total hysterectomy with bilateral salpingoophorectomy on 10/10/18, of which pathology revealed 6 benign lymph nodes, endometrioid adenocarcinoma, 1.8 cm high grade, superficial myometrial invasion, LVSI involvement by tumor, ovaries and fallopian tubes free of tumor, margins uninvolved.    she denies pain and any other symptoms.    PREVIOUS RADIATION THERAPY: No  PAST MEDICAL HISTORY:  has a past medical history of Abnormal perimenopausal bleeding, Anemia, Developmental delay, Diabetes mellitus without complication ([place]), Family history of breast cancer, Family history of pancreatic cancer, Heart murmur, Hepatitis (1990's), and Seizure ([place]) (30 yrs ago).    PAST SURGICAL HISTORY: Past Surgical History:  Procedure Laterality Date   CHOLECYSTECTOMY  20 yrs ago   [place] N/A 09/16/2018   Procedure: [place];  Surgeon: Servando Salina, MD;  Location: [place];  Service: Gynecology;  Laterality: N/A;   EYE SURGERY     Patient was 54 years old.   LYMPH NODE BIOPSY N/A 10/10/2018   Procedure: SENTINEL  LYMPH NODE BIOPSY;  Surgeon: [name], MD;  Location: [facility];  Service: Gynecology;  Laterality: N/A;   ROBOTIC ASSISTED TOTAL HYSTERECTOMY WITH BILATERAL SALPINGO OOPHERECTOMY Bilateral 10/10/2018   Procedure: XI ROBOTIC ASSISTED TOTAL HYSTERECTOMY WITH BILATERAL SALPINGO OOPHORECTOMY;  Surgeon: [name], MD;  Location: [facility];  Service: Gynecology;  Laterality: Bilateral;    FAMILY HISTORY: family history includes Breast cancer (age of onset: 36) in her maternal aunt; Endometrial cancer in her maternal aunt; Leukemia in her maternal aunt; Lung cancer in her maternal uncle; Pancreatic cancer in her maternal aunt; Prostate cancer in her father; Stroke in her maternal aunt and  mother.  SOCIAL HISTORY:  reports that she has never smoked. She has never used smokeless tobacco. She reports that she does not drink alcohol or use drugs.  ALLERGIES: Patient has no known allergies.  MEDICATIONS:  Current Outpatient Medications  Medication Sig Dispense Refill   Accu-Chek Softclix Lancets lancets E 11.9, check blood sugar once daily 100 each 1   Blood Glucose Monitoring Suppl (ACCU-CHEK AVIVA) device E 11.9 Use as instructed 1 each 0   carbamazepine (TEGRETOL XR) 400 MG 12 hr tablet Take 400 mg by mouth 2 (two) times daily.      glucose blood test strip E11.9, for once daily checks 100 each 1   ibuprofen (ADVIL,MOTRIN) 800 MG tablet Take 1 tablet (800 mg total) by mouth every 8 (eight) hours as needed for moderate pain. 30 tablet 1   metFORMIN (GLUCOPHAGE) 500 MG tablet Take 500 mg by mouth 2 (two) times daily with a meal.      rosuvastatin (CRESTOR) 20 MG tablet Take 20 mg by mouth daily.     No current facility-administered medications for this encounter.     REVIEW OF SYSTEMS:  A 10+ POINT REVIEW OF SYSTEMS WAS OBTAINED including neurology, dermatology, psychiatry, cardiac, respiratory, lymph, extremities, GI, GU, musculoskeletal, constitutional, reproductive, HEENT. All pertinent positives are noted in the HPI. All others are negative.     PHYSICAL EXAM:  height is '4\' 10"'  (1.473 m) and weight is 146 lb 4 oz (66.3 kg). Her oral temperature is 97.8 F (36.6 C). Her blood pressure is 149/89 (abnormal) and her pulse is 89. Her respiration is 18 and oxygen saturation is 100%.   General: Alert and oriented, in no acute distress HEENT: Head is normocephalic. Extraocular movements are intact. Oropharynx is clear. Neck: Neck is supple, no palpable cervical or supraclavicular lymphadenopathy. Heart: Regular in rate and rhythm with no murmurs, rubs, or gallops. Chest: Clear to auscultation bilaterally, with no rhonchi, wheezes, or rales. Abdomen: Soft, nontender,  nondistended, with no rigidity or guarding. Laparoscopic scars healing well without signs of drainage or infection. Extremities: No cyanosis or edema. Lymphatics: see Neck Exam Skin: No concerning lesions. Musculoskeletal: symmetric strength and muscle tone throughout. Neurologic: Cranial nerves II through XII are grossly intact. No obvious focalities. Speech is fluent. Coordination is intact. Psychiatric: Judgment and insight are intact. Affect is appropriate. Pelvic exam deferred until simulation and planning day.    ECOG = 1  0 - Asymptomatic (Fully active, able to carry on all predisease activities without restriction)  1 - Symptomatic but completely ambulatory (Restricted in physically strenuous activity but ambulatory and able to carry out work of a light or sedentary nature. For example, light housework, office work)  2 - Symptomatic, <50% in bed during the day (Ambulatory and capable of all self care but unable to carry out any work activities. Up and about more than 50% of waking hours)  3 - Symptomatic, >50% in bed, but not bedbound (Capable of only limited self-care, confined to bed or chair 50% or more of waking hours)  4 - Bedbound (Completely disabled. Cannot carry on any self-care. Totally confined to bed or chair)  5 - Death   Eustace Pen MM, Creech RH, Tormey DC, et al. [phone]). "Toxicity and response criteria of the [facility] Group". [place] Oncol. 5 (6): 649-55  LABORATORY DATA:  Lab Results  Component Value Date   WBC 6.2 10/08/2018   HGB 12.1 10/08/2018   HCT 37.2 10/08/2018   MCV 93.0  10/08/2018   PLT 298 10/08/2018   Lab Results  Component Value Date   NA 132 (L) 10/08/2018   K 4.0 10/08/2018   CL 98 10/08/2018   CO2 27 10/08/2018   GLUCOSE 231 (H) 10/08/2018   CREATININE 0.43 (L) 10/08/2018   CALCIUM 9.0 10/08/2018      RADIOGRAPHY: No results found.    IMPRESSION: stageIAgrade 3  endometrial adenocarcinoma, p53 wild type.  Given her pathologic findings, pt would be at risk for vaginal cuff recurrence. Would recommend vaginal vault brachytherapy as part of her overall management.   Today, I talked to the patient about the findings and work-up thus far.  We discussed the natural history of her cancer and general treatment, highlighting the role of radiotherapy (vaginal brachytherapy) in the management.  We discussed the available radiation techniques, and focused on the details of logistics and delivery.  We reviewed the anticipated acute and late sequelae associated with radiation in this setting.  The patient was encouraged to ask questions that I answered to the best of my ability.  A patient consent form was discussed and signed.  We retained a copy for our records.  The patient would like to proceed with radiation and will be scheduled for CT simulation.   PLAN: She will be set up for vaginal brachytherapy simulation and treatment in the next couple of weeks. Anticipate 5 high dose rate brachytherapy treatments directed at the vaginal cuff.    ------------------------------------------------  Blair Promise, PhD, MD      This document serves as a record of services personally performed by Gery Pray, MD. It was created on his behalf by Mary-Margaret Loma Messing, a trained medical scribe. The creation of this record is based on the scribe's personal observations and the provider's statements to them. This document has been checked and approved by the attending provider.

## 2018-11-25 NOTE — Patient Instructions (Signed)
Coronavirus (COVID-19) Are you at risk?  Are you at risk for the Coronavirus (COVID-19)?  To be considered HIGH RISK for Coronavirus (COVID-19), you have to meet the following criteria:  . Traveled to [place], [place], [place], [place] or [place]; or in the [place] to [place], [place], [place], or [place]; and have fever, cough, and shortness of breath within the last 2 weeks of travel OR . Been in close contact with a person diagnosed with COVID-19 within the last 2 weeks and have fever, cough, and shortness of breath . IF YOU DO NOT MEET THESE CRITERIA, YOU ARE CONSIDERED LOW RISK FOR COVID-19.  What to do if you are HIGH RISK for COVID-19?  . If you are having a medical emergency, call 911. . Seek medical care right away. Before you go to a doctor's office, urgent care or emergency department, call ahead and tell them about your recent travel, contact with someone diagnosed with COVID-19, and your symptoms. You should receive instructions from your physician's office regarding next steps of care.  . When you arrive at healthcare provider, tell the healthcare staff immediately you have returned from visiting [place], [place], [place], [place] or [place]; or traveled in the [place] to [place], [place], [place], or [place]; in the last two weeks or you have been in close contact with a person diagnosed with COVID-19 in the last 2 weeks.   . Tell the health care staff about your symptoms: fever, cough and shortness of breath. . After you have been seen by a medical provider, you will be either: o Tested for (COVID-19) and discharged home on quarantine except to seek medical care if symptoms worsen, and asked to  - Stay home and avoid contact with others until you get your results (4-5 days)  - Avoid travel on public transportation if possible (such as bus, train, or airplane) or o Sent to the Emergency Department by EMS for evaluation, COVID-19 testing, and possible  admission depending on your condition and test results.  What to do if you are LOW RISK for COVID-19?  Reduce your risk of any infection by using the same precautions used for avoiding the common cold or flu:  . Wash your hands often with soap and warm water for at least 20 seconds.  If soap and water are not readily available, use an alcohol-based hand sanitizer with at least 60% alcohol.  . If coughing or sneezing, cover your mouth and nose by coughing or sneezing into the elbow areas of your shirt or coat, into a tissue or into your sleeve (not your hands). . Avoid shaking hands with others and consider head nods or verbal greetings only. . Avoid touching your eyes, nose, or mouth with unwashed hands.  . Avoid close contact with people who are sick. . Avoid places or events with large numbers of people in one location, like concerts or sporting events. . Carefully consider travel plans you have or are making. . If you are planning any travel outside or inside the [place], visit the CDC's Travelers' Health webpage for the latest health notices. . If you have some symptoms but not all symptoms, continue to monitor at home and seek medical attention if your symptoms worsen. . If you are having a medical emergency, call 911.   ADDITIONAL HEALTHCARE OPTIONS FOR PATIENTS  [place] Tele[facility] / e-Visit: [email or web address]         [facility]: [phone]  [place]   [facility]: [phone]                   [facility]: [phone]   

## 2018-11-25 NOTE — Progress Notes (Signed)
GYN Location of Tumor / Histology: stage IA grade 3 serous/mixed endometrial adenocarcinoma s/p staging procedure on 10/10/18  [name] presented with symptoms of: The patient has possibly transition through menopause approximately 2 years ago, although is unclear if she is postmenopausal or not as she is always had a history of abnormal uterine bleeding.  The patient has MR and developmental delay delay and her primary caregivers are her sister in [place] and her brother in [place].  Her sister reports that she developed left lower quadrant pain around Thanksgiving of 2019.  In December 2019 she began reporting heavy vaginal bleeding which became so persistent in January 2020 that she became anemic and required 2 unit packed cell blood transfusion.  She was then taken to see Dr. cousins for further evaluation and Dr. cousins performed a transvaginal ultrasound scan on August 20, 2018 which revealed a uterus measuring 8.5 x 4.8 x 5.3 cm with an endometrial thickness of 25 mm.  This was followed up by a sonohysterogram which revealed endometrial wall thickening within the fundal portion measuring 3 cm with hypervascularity.  She then underwent a D&C in the operating room on September 16, 2018 this was a diagnostic hysteroscopy, hysteroscopic resection of the endometrial mass and D&C.  The procedure was uncomplicated.  The final pathology revealed a high-grade endometrial carcinoma, with a differential diagnosis including high-grade endometrioid and a mixed endometrioid serous carcinoma.  P53 stain was normal.  P 16 did not show significant overexpression.  The patient has some intellectual impairment from developmental delay.  She is has a seizure disorder though this included on a singular grand mal seizure approximately 30 years ago.  She takes Tegretol for this.  She has type 2 diabetes mellitus which is controlled with metformin.  Her only prior abdominal surgery was a laparoscopic  cholecystectomy.  Biopsies revealed: 10/10/18:  7. Uterus +/- tubes/ovaries, neoplastic, cervix, bilateral fallopian tubes and ovaries - HIGH ENDOMETRIOID ADENOCARCINOMA, 1.8 CM. - SUPERFICIAL MYOMETRIAL INVASION. - LYMPHOVASCULAR SPACE INVOLVEMENT BY TUMOR. - MARGINS NOT INVOLVED. - CERVIX, BILATERAL OVARIES AND FALLOPIAN TUBES FREE OF TUMOR. Microscopic Comment 7. UTERUS, CARCINOMA OR CARCINOSARCOMA Procedure: Hysterectomy with bilateral salpingo-oophorectomy and sentinel lymph nodes. Histologic type: Endometrioid adenocarcinoma, p53 wild type (see previous endometrial biopsy, [id number]). Histologic Grade: FIGO grade III. Myometrial invasion: Yes.  Past/Anticipated interventions by Gyn/Onc surgery, if any: 10/10/18:  Operation: Robotic-assisted laparoscopic total hysterectomy with bilateral salpingoophorectomy, SLN biopsy   Surgeon: Donaciano Eva  Past/Anticipated interventions by medical oncology, if any: None at this time.  Weight changes, if any:  Wt Readings from Last 3 Encounters:  11/25/18 146 lb 4 oz (66.3 kg)  11/13/18 142 lb (64.4 kg)  10/10/18 138 lb 11.2 oz (62.9 kg)     Bowel/Bladder complaints, if any: Gastro Intestinal  No nausea, vomitting, or diarrhoea. No bright red blood per rectum, no abdominal pain, change in bowel movement, or constipation.  Genito Urinary  No frequency, urgency, dysuria, no bleeding   Pain issues, if any:  Pt denies c/o pain.   SAFETY ISSUES:  Prior radiation? No  Pacemaker/ICD? No  Possible current pregnancy? No  Is the patient on methotrexate? No  Current Complaints / other details:  Pt presents today for initial consult with Dr. Sondra Come for Radiation Oncology. Pt is unaccompanied but has cell phone to call sister with once physician is in room. Pt worried about reception on cell. This RN assured pt that a phone from clinic can be used.   BP (!) 149/89 (  BP Location: Left Arm, Patient Position: Sitting)   Pulse 89    Temp 97.8 F (36.6 C) (Oral)   Resp 18   Ht _0  (1.473 m)   Wt 146 lb 4 oz (66.3 kg)   SpO2 100%   BMI 30.57 kg/m  Loma Sousa, RN BSN

## 2018-11-26 ENCOUNTER — Telehealth: Payer: Self-pay | Admitting: *Deleted

## 2018-11-26 NOTE — Telephone Encounter (Signed)
Called patient's sister - Alvis Lemmings to inform of New HDR [place], lvm for a return call

## 2018-11-28 ENCOUNTER — Telehealth: Payer: Self-pay | Admitting: Genetic Counselor

## 2018-11-28 NOTE — Telephone Encounter (Signed)
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  We will talk with the patient and her sister on Monday.

## 2018-11-28 NOTE — Telephone Encounter (Signed)
LM on VM that her sisters' genetic testing has come back and to please give me a call.

## 2018-12-02 ENCOUNTER — Encounter: Payer: Self-pay | Admitting: Genetic Counselor

## 2018-12-02 ENCOUNTER — Ambulatory Visit: Payer: Medicare Other

## 2018-12-02 ENCOUNTER — Institutional Professional Consult (permissible substitution): Payer: Medicare Other | Admitting: Radiation Oncology

## 2018-12-04 ENCOUNTER — Telehealth: Payer: Self-pay | Admitting: *Deleted

## 2018-12-04 NOTE — Telephone Encounter (Signed)
Called patient's sister - Alvis Lemmings to remind of HDR [place] for 12-05-18, spoke with patient's sister- Alvis Lemmings and she is aware of these appts.

## 2018-12-05 ENCOUNTER — Ambulatory Visit
Admission: RE | Admit: 2018-12-05 | Discharge: 2018-12-05 | Disposition: A | Discharge status: Home / Self Care | Payer: Medicare Other | Source: Ambulatory Visit | Attending: Radiation Oncology | Admitting: Radiation Oncology

## 2018-12-05 ENCOUNTER — Encounter: Payer: Self-pay | Admitting: Radiation Oncology

## 2018-12-05 ENCOUNTER — Inpatient Hospital Stay: Payer: Medicare Other | Attending: Gynecologic Oncology | Admitting: Genetic Counselor

## 2018-12-05 VITALS — BP 168/95 | HR 86 | Temp 97.8°F | Resp 18 | Ht <= 58 in | Wt 148.1 lb

## 2018-12-05 DIAGNOSIS — Z90722 Acquired absence of ovaries, bilateral: Secondary | ICD-10-CM | POA: Insufficient documentation

## 2018-12-05 DIAGNOSIS — Z1509 Genetic susceptibility to other malignant neoplasm: Secondary | ICD-10-CM

## 2018-12-05 DIAGNOSIS — Z8669 Personal history of other diseases of the nervous system and sense organs: Secondary | ICD-10-CM | POA: Diagnosis not present

## 2018-12-05 DIAGNOSIS — Z7984 Long term (current) use of oral hypoglycemic drugs: Secondary | ICD-10-CM | POA: Diagnosis not present

## 2018-12-05 DIAGNOSIS — R011 Cardiac murmur, unspecified: Secondary | ICD-10-CM | POA: Diagnosis not present

## 2018-12-05 DIAGNOSIS — C541 Malignant neoplasm of endometrium: Secondary | ICD-10-CM | POA: Diagnosis not present

## 2018-12-05 DIAGNOSIS — Z1379 Encounter for other screening for genetic and chromosomal anomalies: Secondary | ICD-10-CM

## 2018-12-05 DIAGNOSIS — E119 Type 2 diabetes mellitus without complications: Secondary | ICD-10-CM | POA: Insufficient documentation

## 2018-12-05 DIAGNOSIS — Z800 Family history of malignant neoplasm of digestive organs: Secondary | ICD-10-CM | POA: Diagnosis not present

## 2018-12-05 DIAGNOSIS — Z803 Family history of malignant neoplasm of breast: Secondary | ICD-10-CM | POA: Insufficient documentation

## 2018-12-05 DIAGNOSIS — Z79899 Other long term (current) drug therapy: Secondary | ICD-10-CM | POA: Insufficient documentation

## 2018-12-05 DIAGNOSIS — D649 Anemia, unspecified: Secondary | ICD-10-CM | POA: Insufficient documentation

## 2018-12-05 DIAGNOSIS — Z90710 Acquired absence of both cervix and uterus: Secondary | ICD-10-CM | POA: Diagnosis not present

## 2018-12-05 DIAGNOSIS — F419 Anxiety disorder, unspecified: Secondary | ICD-10-CM | POA: Insufficient documentation

## 2018-12-05 NOTE — Progress Notes (Signed)
  Radiation Oncology         [phone] ________________________________  Name: Mariah Mcclure MRN: [id number]  Date: 12/05/2018  DOB: [birth year]  Vaginal Brachytherapy Procedure Note  CC: System, Pcp Not In Mariah Amber, MD    ICD-10-CM   1. Endometrial cancer (HCC) C54.1     Diagnosis: 53 y.o. female with stageIAgrade 3  endometrial adenocarcinoma, p53 normal wild typeexpression pattern  Narrative: She returns today for vaginal cylinder fitting. She is anxious. She reports no new symptoms since initial consultation. She denies any pain. She denies dysuria or hematuria. She denies vaginal bleeding or discharge. She denies rectal bleeding, diarrhea, or constipation.   ALLERGIES: has No Known Allergies.  Meds: Current Outpatient Medications  Medication Sig Dispense Refill  . Accu-Chek Softclix Lancets lancets E 11.9, check blood sugar once daily 100 each 1  . Blood Glucose Monitoring Suppl (ACCU-CHEK AVIVA) device E 11.9 Use as instructed 1 each 0  . carbamazepine (TEGRETOL XR) 400 MG 12 hr tablet Take 400 mg by mouth 2 (two) times daily.     Marland Kitchen glucose blood test strip E11.9, for once daily checks 100 each 1  . ibuprofen (ADVIL,MOTRIN) 800 MG tablet Take 1 tablet (800 mg total) by mouth every 8 (eight) hours as needed for moderate pain. 30 tablet 1  . metFORMIN (GLUCOPHAGE) 500 MG tablet Take 500 mg by mouth 2 (two) times daily with a meal.     . rosuvastatin (CRESTOR) 20 MG tablet Take 20 mg by mouth daily.     No current facility-administered medications for this encounter.     Physical Findings: The patient is in no acute distress. Patient is alert and oriented.  height is '4\' 10"'$  (1.473 m) and weight is 148 lb 2 oz (67.2 kg). Her oral temperature is 97.8 F (36.6 C). Her blood pressure is 168/95 (abnormal) and her pulse is 86. Her respiration is 18 and oxygen saturation is 100%.   No palpable cervical, supraclavicular or axillary lymphoadenopathy. The heart has a regular rate  and rhythm. The lungs are clear to auscultation. Abdomen soft and non-tender.  On pelvic examination the external genitalia were unremarkable. A speculum exam was performed. Vaginal cuff intact, no mucosal lesions. On bimanual exam there were no pelvic masses appreciated.  Lab Findings: Lab Results  Component Value Date   WBC 6.2 10/08/2018   HGB 12.1 10/08/2018   HCT 37.2 10/08/2018   MCV 93.0 10/08/2018   PLT 298 10/08/2018    Radiographic Findings: No results found.  Impression: StageIAgrade 3  endometrial adenocarcinoma, p53 normal wild typeexpression pattern.   Patient was fitted for a vaginal cylinder. The patient will be treated with a 2.5 cm diameter cylinder with a treatment length of 3.0 cm. This distended the vaginal vault without undue discomfort. The patient tolerated the procedure well.  The patient was successfully fitted for a vaginal cylinder. The patient is appropriate to begin vaginal brachytherapy.   Plan: The patient will proceed with CT simulation and vaginal brachytherapy today.    _______________________________   Mariah Promise, PhD, MD  This document serves as a record of services personally performed by Gery Pray, MD. It was created on his behalf by Rae Lips, a trained medical scribe. The creation of this record is based on the scribe's personal observations and the provider's statements to them. This document has been checked and approved by the attending provider.

## 2018-12-05 NOTE — Progress Notes (Signed)
Pt presents today for new [name] HDR with Dr. Sondra Come. Pt is anxious. Pt denies c/o pain. Pt denies dysuria/hematuria. Pt denies vaginal bleeding/discharge. Pt denies rectal bleeding, diarrhea/constipation.   BP (!) 168/95 (BP Location: Left Arm, Patient Position: Sitting)   Pulse 86   Temp 97.8 F (36.6 C) (Oral)   Resp 18   Ht 4\' 10"  (1.473 m)   Wt 148 lb 2 oz (67.2 kg)   SpO2 100%   BMI 30.96 kg/m   Wt Readings from Last 3 Encounters:  12/05/18 148 lb 2 oz (67.2 kg)  11/25/18 146 lb 4 oz (66.3 kg)  11/13/18 142 lb (64.4 kg)   Loma Sousa, RN BSN

## 2018-12-05 NOTE — Progress Notes (Signed)
  Radiation Oncology         (336) [phone] ________________________________  Name: Mariah Mcclure MRN: [id number]  Date: 12/05/2018  DOB: 1965/09/26  CC: System, Pcp Not In  [name], MD  HDR BRACHYTHERAPY NOTE  DIAGNOSIS: 53 y.o. female with stageIAgrade 3  endometrial adenocarcinoma, p53 normal wild typeexpression pattern   Simple treatment device note: Patient had construction of her custom vaginal cylinder. She will be treated with a 2.5 cm diameter segmented cylinder. This conforms to her anatomy without undue discomfort.  Vaginal brachytherapy procedure node: The patient was brought to the [facility]. Identity was confirmed. All relevant records and images related to the planned course of therapy were reviewed. The patient freely provided informed written consent to proceed with treatment after reviewing the details related to the planned course of therapy. The consent form was witnessed and verified by the simulation staff. Then, the patient was set-up in a stable reproducible supine position for radiation therapy. Pelvic exam revealed the vaginal cuff to be intact. The patient's custom vaginal cylinder was placed in the proximal vagina. This was affixed to the CT/MR stabilization plate to prevent slippage. Patient tolerated the placement well.  Verification simulation note:  A fiducial marker was placed within the vaginal cylinder. An AP and lateral film was then obtained through the pelvis area. This documented accurate position of the vaginal cylinder for treatment.  HDR BRACHYTHERAPY TREATMENT  The remote afterloading device was affixed to the vaginal cylinder by catheter. Patient then proceeded to undergo her first high-dose-rate treatment directed at the proximal vagina. The patient was prescribed a dose of 6.0 gray to be delivered to the mucosal surface. Treatment length was 3.0 cm. Patient was treated with 1 channel using 7 dwell positions. Treatment time was 167.4 seconds.  Iridium 192 was the high-dose-rate source for treatment. The patient tolerated the treatment well. After completion of her therapy, a radiation survey was performed documenting return of the iridium source into the GammaMed safe.   PLAN: The patient will return on 12/11/2018 for her second HDR treatment. ________________________________  Blair Promise, PhD, MD   This document serves as a record of services personally performed by Gery Pray, MD. It was created on his behalf by Rae Lips, a trained medical scribe. The creation of this record is based on the scribe's personal observations and the provider's statements to them. This document has been checked and approved by the attending provider.

## 2018-12-05 NOTE — Progress Notes (Signed)
REFERRING PROVIDER: [name], MD [place], [place] [address]  PRIMARY PROVIDER:  System, Pcp Not In  PRIMARY REASON FOR VISIT:  1. Lynch syndrome   2. Genetic testing     GENETIC TEST RESULTS   Patient Name: Mariah Mcclure Patient Age: 53 y.o. Encounter Date: 12/05/2018  HPI:I connected with Mariah Mcclure on 12/05/2018 at 2:00 PM EDT by Webex video conference and verified that I am speaking with the correct person using two identifiers.   Patient location: Home Provider location: Office   Mariah Mcclure was previously seen in the [facility] due to a family of cancer and concerns regarding a hereditary predisposition to cancer. Please refer to our prior cancer genetics clinic note for more information regarding Mariah Mcclure's medical, social and family histories, and our assessment and recommendations, at the time. Mariah Mcclure recent genetic test results were disclosed to her, as were recommendations warranted by these results. These results and recommendations are discussed in more detail below.   FAMILY HISTORY:  We obtained a detailed, 4-generation family history.  Significant diagnoses are listed below: Family History  Problem Relation Age of Onset  . Stroke Mother   . Prostate cancer Father   . Breast cancer Maternal Aunt 70  . Lung cancer Maternal Uncle   . Endometrial cancer Maternal Aunt   . Stroke Maternal Aunt   . Pancreatic cancer Maternal Aunt   . Leukemia Maternal Aunt     The patient does not have children.  She has two full brothers and a full sister, a maternal half brother and two paternal half sisters.  None of her siblings have cancer.  Both of the patient's parents are deceased.  The patient's mother died of a combination of strokes and sepsis.  She had five brothers and four sisters.  One brother had lung cancer, one sister had cervical cancer, a second sister had leukemia and then pancreatic cancer at 47, a third sister  had an unknown 'reproductive' cancer at 2 and the fourth sister had breast cancer at 36.  The maternal grandparents are deceased from non cancer related issues.  The patient's father left the family 85 years ago, and there has been little contact.  What is known is that he had prostate cancer around age 33 and died at 58.  He had at least two daughters who the family knows about but have not had contact with.  He had one brother with prostate cancer.  Mariah Mcclure is unaware of previous family history of genetic testing for hereditary cancer risks. Patient's maternal ancestors are of [place] descent, and paternal ancestors are of [place] descent. There is possible reported Ashkenazi Jewish ancestry. There is no known consanguinity.  GENETIC TESTING:  At the time of Ms. Wiederhold's visit, we recommended she pursue genetic testing of the common hereditary cancer panel. 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 STK11, TP53, TSC1, TSC2, and VHL .  A copy of the test report has been scanned into Epic for review.   SCREENING RECOMMENDATIONS: We discussed  the implications of Lynch syndrome for Mariah Mcclure, and discussed who else in the family should have genetic testing. We recommended Mariah Mcclure follow management guidelines for Lynch syndrome; all of which are outlined below. These can be coordinated by Mariah Mcclure GI doctor or her primary provider.   1. Annual colonoscopy.    2. While there is no clear evidence to support screening for stomach and small bowel cancer, an upper endoscopy can be considered at 3-5 year intervals beginning at age 55-35. However, whether to have this screening is best determined by the gastroenterologist.   3. Annual urinalysis.   4. For patients with colorectal adenocarcinoma, segmental or extended colectomy may be considered.  For women with Lynch syndrome, unlike the effective surveillance plan for colorectal cancer risk, there is no professional agreement regarding management for the increased risk of uterine and ovarian cancer. For endometrial cancer, women are encouraged to be aware of and immediately report dysfunctional or post-menopausal bleeding, which should then be followed up by an endometrial biopsy. In terms of surveillance, transvaginal ultrasound and endometrial biopsies have not been shown to be effective screening tools; however, they may be considered at the clinician's discretion. Importantly, transvaginal ultrasound is not recommended in pre-menopausal women due to variable presentations throughout a normal menstrual cycle. Endometrial biopsy can be considered every 1-2 years, but does not have proven benefit of reducing mortality in women with Lynch syndrome given the typically early presenting symptoms. Finally, while hysterectomy has not been shown to reduce endometrial cancer mortality, it does reduce incidence, and therefore, can be considered as a risk-reducing option.  Unfortunately, symptoms of early stage ovarian cancer are not as obvious as endometrial cancer; however, women are encouraged to be aware of symptoms, such as pelvic or abdominal pain, bloating, increased abdominal girth, difficulty eating, feeling full from eating quickly, as well as increased urinary frequency and urgency. In terms of surveillance, transvaginal ultrasound examination and serum CA-125 have not been shown to be sufficiently sensitive or  specific to support for routine screening. In terms of risk-reducing surgery, historically, women have been recommended to undergo a bilateral salpingo-oophorectomy (BSO) regardless of gene mutation; however, with the collection of gene-specific data, this recommendation has evolved.  The most recent NCCN guidelines (v2.2019) notes the limited data to make specific recommendations for BSO in MSH6 and PMS2 mutation carriers. However, due to Mariah Mcclure diagnosis of uterine cancer she has had a complete hysterectomy, including a BSO. It is important to note that these guidelines do not cite the most recent article published with gene-specific risks (Dominguez-Valentin et al., 2019), which does quote a 3% ovarian cancer risk by age 73. Therefore, the decision to undergo a BSO, especially with this low-penetrance gene, should be individualized based on personal and family history.  However, we are available to help women and their providers establish an individualized surveillance plan. It is also important for women to understand the following:   1. Women should seek medical attention if they experience abnormal vaginal bleeding.  2. Some providers may still recommend vaginal ultrasounds, uterine biopsies (for uterine cancer risk) and/or CA-125 analysis ( for ovarian cancer risk), even though these have not been shown to be effective.  3. A hysterectomy with removal of the ovaries and fallopian tubes could be considered once childbearing is completed (if planned).  FAMILY MEMBERS: Since we now know the mutation in Mariah Mcclure, we can test at-risk relatives to determine whether or not they have inherited the mutation and are at  increased risk for cancer.  It is important that all of Mariah Mcclure relatives (both men and women) know of the presence of this gene mutation. Site-specific genetic testing can sort out who in the family is at risk and who is not. We will be happy to meet with any of the family  members or refer them to a genetic counselor in their local area. To locate genetic counselors in other cities, individuals can visit the website of the [employer] (ArtistMovie.se) and Secretary/administrator for a Social worker by zip code. A list of genetic counselors in [place], [place] and [place] was provided to Mariah Mcclure sister so that she could help direct her family for testing.  Ms. Daw siblings have a 50% chance to have inherited this mutation. We recommend they have genetic testing for this same mutation, as identifying the presence of this mutation would allow them to also take advantage of risk-reducing measures.   Children who inherit two mutations in the same Lynch gene, one mutation from each parent, are at-risk of a rare recessive condition called constitutional mismatch repair deficiency (CMMR-D) syndrome. If family members have this mutation, they may wish to have their partner tested if they are planning on having children.  PLAN: Ms. Criscione will need to be followed as high risk based on her diagnosis of Lynch syndrome.    Ms. Haydon does not have a gastroenterologist to perform follow up for the diagnosis of Lynch syndrome. She typically lives in [place] with her brother, although she is currently living with her sister, Alvis Lemmings.  Ms. Smarr will be in [place] for approximately 2 more months during the treatment of her uterine cancer and then head back to [place].  We have asked that she be followed by a GI physician once she is back in [place].  Ms. Folk will follow up with a GYN physician when she goes back to [place].  RESOURCES:  Ms. Hine was given patient information about Lynch syndrome that was written by the national support group "it takes guts".    We strongly encouraged Ms. Monsivais to remain in contact with [place] in cancer genetics on an annual basis so we can update Ms. Herringshaw's personal and family histories, and inform her of advances in cancer  genetics that may be of benefit for the entire family. Ms. Marin knows she is also welcome to call with any questions or concerns, at any time.   Callia Swim P. Florene Glen, [place], [facility]  Certified Genetic Counselor  [phone]  The patient was seen for a total of 45 minutes in face-to-face genetic counseling.

## 2018-12-05 NOTE — Progress Notes (Signed)
  Radiation Oncology         (336) [phone] ________________________________  Name: Mariah Mcclure MRN: [id number]  Date: 12/05/2018  DOB: 1966-06-05  SIMULATION AND TREATMENT PLANNING NOTE HDR BRACHYTHERAPY  DIAGNOSIS:  53 y.o. female with stageIAgrade 3  endometrial adenocarcinoma, p53 normal wild typeexpression pattern  NARRATIVE:  The patient was brought to the [facility].  Identity was confirmed.  All relevant records and images related to the planned course of therapy were reviewed.  The patient freely provided informed written consent to proceed with treatment after reviewing the details related to the planned course of therapy. The consent form was witnessed and verified by the simulation staff.  Then, the patient was set-up in a stable reproducible  supine position for radiation therapy.  CT images were obtained.  Surface markings were placed.  The CT images were loaded into the planning software.  Then the target and avoidance structures were contoured.  Treatment planning then occurred.  The radiation prescription was entered and confirmed.   I have requested : Brachytherapy Isodose Plan and Dosimetry Calculations to plan the radiation distribution.    PLAN:  The patient will receive 30 Gy in 5 fractions. Iridium-192 will be the high-dose-rate source. The patient will be treated with a 2.5 cm diameter cylinder with a treatment length of 3.0 cm.    ________________________________  Blair Promise, PhD, MD  This document serves as a record of services personally performed by Gery Pray, MD. It was created on his behalf by Rae Lips, a trained medical scribe. The creation of this record is based on the scribe's personal observations and the provider's statements to them. This document has been checked and approved by the attending provider.

## 2018-12-05 NOTE — Patient Instructions (Signed)
Coronavirus (COVID-19) Are you at risk?  Are you at risk for the Coronavirus (COVID-19)?  To be considered HIGH RISK for Coronavirus (COVID-19), you have to meet the following criteria:  . Traveled to [place], [place], [place], [place] or [place]; or in the [place] to [place], [place], [place], or [place]; and have fever, cough, and shortness of breath within the last 2 weeks of travel OR . Been in close contact with a person diagnosed with COVID-19 within the last 2 weeks and have fever, cough, and shortness of breath . IF YOU DO NOT MEET THESE CRITERIA, YOU ARE CONSIDERED LOW RISK FOR COVID-19.  What to do if you are HIGH RISK for COVID-19?  . If you are having a medical emergency, call 911. . Seek medical care right away. Before you go to a doctor's office, urgent care or emergency department, call ahead and tell them about your recent travel, contact with someone diagnosed with COVID-19, and your symptoms. You should receive instructions from your physician's office regarding next steps of care.  . When you arrive at healthcare provider, tell the healthcare staff immediately you have returned from visiting [place], [place], [place], [place] or [place]; or traveled in the [place] to [place], [place], [place], or [place]; in the last two weeks or you have been in close contact with a person diagnosed with COVID-19 in the last 2 weeks.   . Tell the health care staff about your symptoms: fever, cough and shortness of breath. . After you have been seen by a medical provider, you will be either: o Tested for (COVID-19) and discharged home on quarantine except to seek medical care if symptoms worsen, and asked to  - Stay home and avoid contact with others until you get your results (4-5 days)  - Avoid travel on public transportation if possible (such as bus, train, or airplane) or o Sent to the Emergency Department by EMS for evaluation, COVID-19 testing, and possible  admission depending on your condition and test results.  What to do if you are LOW RISK for COVID-19?  Reduce your risk of any infection by using the same precautions used for avoiding the common cold or flu:  . Wash your hands often with soap and warm water for at least 20 seconds.  If soap and water are not readily available, use an alcohol-based hand sanitizer with at least 60% alcohol.  . If coughing or sneezing, cover your mouth and nose by coughing or sneezing into the elbow areas of your shirt or coat, into a tissue or into your sleeve (not your hands). . Avoid shaking hands with others and consider head nods or verbal greetings only. . Avoid touching your eyes, nose, or mouth with unwashed hands.  . Avoid close contact with people who are sick. . Avoid places or events with large numbers of people in one location, like concerts or sporting events. . Carefully consider travel plans you have or are making. . If you are planning any travel outside or inside the [place], visit the CDC's Travelers' Health webpage for the latest health notices. . If you have some symptoms but not all symptoms, continue to monitor at home and seek medical attention if your symptoms worsen. . If you are having a medical emergency, call 911.   ADDITIONAL HEALTHCARE OPTIONS FOR PATIENTS  [facility] / e-Visit: [email or web address]         [facility]: [phone]  [facility]: [phone]                   [facility]: [phone]   

## 2018-12-06 ENCOUNTER — Ambulatory Visit: Payer: Medicare Other | Admitting: Radiation Oncology

## 2018-12-09 ENCOUNTER — Ambulatory Visit: Payer: Medicare Other | Admitting: Radiation Oncology

## 2018-12-10 ENCOUNTER — Telehealth: Payer: Self-pay | Admitting: *Deleted

## 2018-12-10 ENCOUNTER — Ambulatory Visit: Payer: Medicare Other | Admitting: Radiation Oncology

## 2018-12-10 NOTE — Telephone Encounter (Signed)
Called patient's sister- Solstice Lastinger to remind of sister's HDR [place]. For 12-11-18 @ 2 pm, spoke with patient's sister and she is aware of this tx.

## 2018-12-11 ENCOUNTER — Other Ambulatory Visit: Payer: Self-pay

## 2018-12-11 ENCOUNTER — Ambulatory Visit
Admission: RE | Admit: 2018-12-11 | Discharge: 2018-12-11 | Disposition: A | Discharge status: Home / Self Care | Payer: Medicare Other | Source: Ambulatory Visit | Attending: Radiation Oncology | Admitting: Radiation Oncology

## 2018-12-11 ENCOUNTER — Ambulatory Visit: Payer: Medicare Other | Admitting: Radiation Oncology

## 2018-12-11 DIAGNOSIS — C541 Malignant neoplasm of endometrium: Secondary | ICD-10-CM

## 2018-12-11 NOTE — Progress Notes (Signed)
  Radiation Oncology         (336) [phone] ________________________________  Name: Mariah Mcclure MRN: [id number]  Date: 12/11/2018  DOB: 24-Feb-1966  CC: System, Pcp Not In  [name], MD  HDR BRACHYTHERAPY NOTE  DIAGNOSIS: 53 y.o. female with stageIAgrade 3  endometrial adenocarcinoma, p53 normal wild typeexpression pattern   Simple treatment device note: Patient had construction of her custom vaginal cylinder. She will be treated with a 2.5 cm diameter segmented cylinder. This conforms to her anatomy without undue discomfort.  Vaginal brachytherapy procedure node: The patient was brought to the [facility] suite. Identity was confirmed. All relevant records and images related to the planned course of therapy were reviewed. The patient freely provided informed written consent to proceed with treatment after reviewing the details related to the planned course of therapy. The consent form was witnessed and verified by the simulation staff. Then, the patient was set-up in a stable reproducible supine position for radiation therapy. Pelvic exam revealed the vaginal cuff to be intact. The patient's custom vaginal cylinder was placed in the proximal vagina. This was affixed to the CT/MR stabilization plate to prevent slippage. Patient tolerated the placement well.  Verification simulation note:  A fiducial marker was placed within the vaginal cylinder. An AP and lateral film was then obtained through the pelvis area. This documented accurate position of the vaginal cylinder for treatment.  HDR BRACHYTHERAPY TREATMENT  The remote afterloading device was affixed to the vaginal cylinder by catheter. Patient then proceeded to undergo her second high-dose-rate treatment directed at the proximal vagina. The patient was prescribed a dose of 6.0 gray to be delivered to the mucosal surface. Treatment length was 3.0 cm. Patient was treated with 1 channel using 7 dwell positions. Treatment time was 176.90 seconds.  Iridium 192 was the high-dose-rate source for treatment. The patient tolerated the treatment well. After completion of her therapy, a radiation survey was performed documenting return of the iridium source into the [employer] safe.   PLAN: The patient will return on 12/18/2018 for her third HDR treatment. ________________________________  Blair Promise, PhD, MD   This document serves as a record of services personally performed by Gery Pray, MD. It was created on his behalf by Rae Lips, a trained medical scribe. The creation of this record is based on the scribe's personal observations and the provider's statements to them. This document has been checked and approved by the attending provider.

## 2018-12-17 ENCOUNTER — Telehealth: Payer: Self-pay | Admitting: *Deleted

## 2018-12-17 NOTE — Telephone Encounter (Signed)
Called patient's sister- Alvis Lemmings to remind her of her sister's treatment on May 20 @ 2 pm, spoke with patient's sister- Alvis Lemmings and she is aware of this tx.

## 2018-12-18 ENCOUNTER — Other Ambulatory Visit: Payer: Self-pay

## 2018-12-18 ENCOUNTER — Ambulatory Visit
Admission: RE | Admit: 2018-12-18 | Discharge: 2018-12-18 | Disposition: A | Discharge status: Home / Self Care | Payer: Medicare Other | Source: Ambulatory Visit | Attending: Radiation Oncology | Admitting: Radiation Oncology

## 2018-12-18 DIAGNOSIS — C541 Malignant neoplasm of endometrium: Secondary | ICD-10-CM | POA: Diagnosis not present

## 2018-12-18 NOTE — Progress Notes (Signed)
  Radiation Oncology         (336) [phone] ________________________________  Name: Mariah Mcclure MRN: [id number]  Date: 12/18/2018  DOB: [birth year]  CC: System, Pcp Not In  [name], MD  HDR BRACHYTHERAPY NOTE  DIAGNOSIS: 53 y.o. female with stageIAgrade 3  endometrial adenocarcinoma, p53 normal wild typeexpression pattern   Simple treatment device note: Patient had construction of her custom vaginal cylinder. She will be treated with a 2.5 cm diameter segmented cylinder. This conforms to her anatomy without undue discomfort.  Vaginal brachytherapy procedure node: The patient was brought to the [facility] suite. Identity was confirmed. All relevant records and images related to the planned course of therapy were reviewed. The patient freely provided informed written consent to proceed with treatment after reviewing the details related to the planned course of therapy. The consent form was witnessed and verified by the simulation staff. Then, the patient was set-up in a stable reproducible supine position for radiation therapy. Pelvic exam revealed the vaginal cuff to be intact. The patient's custom vaginal cylinder was placed in the proximal vagina. This was affixed to the CT/MR stabilization plate to prevent slippage. Patient tolerated the placement well.  Verification simulation note:  A fiducial marker was placed within the vaginal cylinder. An AP and lateral film was then obtained through the pelvis area. This documented accurate position of the vaginal cylinder for treatment.  HDR BRACHYTHERAPY TREATMENT  The remote afterloading device was affixed to the vaginal cylinder by catheter. Patient then proceeded to undergo her third high-dose-rate treatment directed at the proximal vagina. The patient was prescribed a dose of 6.0 gray to be delivered to the mucosal surface. Treatment length was 3.0 cm. Patient was treated with 1 channel using 7 dwell positions. Treatment time was 189.2 seconds.  Iridium 192 was the high-dose-rate source for treatment. The patient tolerated the treatment well. After completion of her therapy, a radiation survey was performed documenting return of the iridium source into the [employer] safe.   PLAN: The patient will return on 12/26/2018 for her fourth HDR treatment. ________________________________  Blair Promise, PhD, MD   This document serves as a record of services personally performed by Gery Pray, MD. It was created on his behalf by Mary-Margaret Loma Messing, a trained medical scribe. The creation of this record is based on the scribe's personal observations and the provider's statements to them. This document has been checked and approved by the attending provider.

## 2018-12-25 ENCOUNTER — Telehealth: Payer: Self-pay | Admitting: *Deleted

## 2018-12-25 NOTE — Telephone Encounter (Signed)
Called patient to remind of [place]. for 12-26-18 @ 2 pm, spoke with patient's sister- Alvis Lemmings and she is aware of this tx.

## 2018-12-26 ENCOUNTER — Other Ambulatory Visit: Payer: Self-pay

## 2018-12-26 ENCOUNTER — Ambulatory Visit
Admission: RE | Admit: 2018-12-26 | Discharge: 2018-12-26 | Disposition: A | Discharge status: Home / Self Care | Payer: Medicare Other | Source: Ambulatory Visit | Attending: Radiation Oncology | Admitting: Radiation Oncology

## 2018-12-26 DIAGNOSIS — C541 Malignant neoplasm of endometrium: Secondary | ICD-10-CM

## 2018-12-26 NOTE — Progress Notes (Signed)
  Radiation Oncology         [phone] ________________________________  Name: Mariah Mcclure MRN: [id number]  Date: 12/26/2018  DOB: 1966-02-06  CC: System, Pcp Not In  [name], MD  HDR BRACHYTHERAPY NOTE  DIAGNOSIS: 53 y.o. female with stageIAgrade 3 endometrial adenocarcinoma, p53normalwild typeexpression pattern   Simple treatment device note: Patient had construction of her custom vaginal cylinder. She will be treated with a 2.5 cm diameter segmented cylinder. This conforms to her anatomy without undue discomfort.  Vaginal brachytherapy procedure node: The patient was brought to the [facility] suite. Identity was confirmed. All relevant records and images related to the planned course of therapy were reviewed. The patient freely provided informed written consent to proceed with treatment after reviewing the details related to the planned course of therapy. The consent form was witnessed and verified by the simulation staff. Then, the patient was set-up in a stable reproducible supine position for radiation therapy. Pelvic exam revealed the vaginal cuff to be intact. The patient's custom vaginal cylinder was placed in the proximal vagina. This was affixed to the CT/MR stabilization plate to prevent slippage. Patient tolerated the placement well.  Verification simulation note:  A fiducial marker was placed within the vaginal cylinder. An AP and lateral film was then obtained through the pelvis area. This documented accurate position of the vaginal cylinder for treatment.  HDR BRACHYTHERAPY TREATMENT  The remote afterloading device was affixed to the vaginal cylinder by catheter. Patient then proceeded to undergo her fourth high-dose-rate treatment directed at the proximal vagina. The patient was prescribed a dose of 6.0 gray to be delivered to the mucosal surface. Treatment length was 3.0 cm. Patient was treated with 1 channel using 7 dwell positions. Treatment time was 203.7 seconds.  Iridium 192 was the high-dose-rate source for treatment. The patient tolerated the treatment well. After completion of her therapy, a radiation survey was performed documenting return of the iridium source into the GammaMed safe.   PLAN: The patient will return on 01/02/2019 for her fifth and final HDR treatment. ________________________________  Blair Promise, PhD, MD   This document serves as a record of services personally performed by Gery Pray, MD. It was created on his behalf by Rae Lips, a trained medical scribe. The creation of this record is based on the scribe's personal observations and the provider's statements to them. This document has been checked and approved by the attending provider.

## 2019-01-01 ENCOUNTER — Telehealth: Payer: Self-pay | Admitting: *Deleted

## 2019-01-01 NOTE — Telephone Encounter (Signed)
CALLED PATIENT'S [facility] TO REMIND OF HDR [place] 01-02-19 @ 1 PM, LVM FOR A RETURN CALL

## 2019-01-02 ENCOUNTER — Encounter: Payer: Self-pay | Admitting: Radiation Oncology

## 2019-01-02 ENCOUNTER — Ambulatory Visit
Admission: RE | Admit: 2019-01-02 | Discharge: 2019-01-02 | Disposition: A | Discharge status: Home / Self Care | Payer: Medicare Other | Source: Ambulatory Visit | Attending: Radiation Oncology | Admitting: Radiation Oncology

## 2019-01-02 ENCOUNTER — Other Ambulatory Visit: Payer: Self-pay

## 2019-01-02 DIAGNOSIS — Z803 Family history of malignant neoplasm of breast: Secondary | ICD-10-CM | POA: Insufficient documentation

## 2019-01-02 DIAGNOSIS — Z800 Family history of malignant neoplasm of digestive organs: Secondary | ICD-10-CM | POA: Diagnosis not present

## 2019-01-02 DIAGNOSIS — Z90710 Acquired absence of both cervix and uterus: Secondary | ICD-10-CM | POA: Insufficient documentation

## 2019-01-02 DIAGNOSIS — R011 Cardiac murmur, unspecified: Secondary | ICD-10-CM | POA: Diagnosis not present

## 2019-01-02 DIAGNOSIS — Z8669 Personal history of other diseases of the nervous system and sense organs: Secondary | ICD-10-CM | POA: Insufficient documentation

## 2019-01-02 DIAGNOSIS — C541 Malignant neoplasm of endometrium: Secondary | ICD-10-CM | POA: Insufficient documentation

## 2019-01-02 DIAGNOSIS — Z7984 Long term (current) use of oral hypoglycemic drugs: Secondary | ICD-10-CM | POA: Diagnosis not present

## 2019-01-02 DIAGNOSIS — Z90722 Acquired absence of ovaries, bilateral: Secondary | ICD-10-CM | POA: Insufficient documentation

## 2019-01-02 DIAGNOSIS — E119 Type 2 diabetes mellitus without complications: Secondary | ICD-10-CM | POA: Diagnosis not present

## 2019-01-02 DIAGNOSIS — D649 Anemia, unspecified: Secondary | ICD-10-CM | POA: Diagnosis not present

## 2019-01-02 DIAGNOSIS — Z79899 Other long term (current) drug therapy: Secondary | ICD-10-CM | POA: Diagnosis not present

## 2019-01-02 NOTE — Progress Notes (Signed)
  Radiation Oncology         [phone] ________________________________  Name: Mariah Mcclure MRN: [id number]  Date: 01/02/2019  DOB: 08/05/1965  CC: System, Pcp Not In  [name], MD  HDR BRACHYTHERAPY NOTE  DIAGNOSIS: 53 y.o. female with stageIAgrade 3 endometrial adenocarcinoma, p53normalwild typeexpression pattern   Simple treatment device note: Patient had construction of her custom vaginal cylinder. She will be treated with a 2.5 cm diameter segmented cylinder. This conforms to her anatomy without undue discomfort.  Vaginal brachytherapy procedure node: The patient was brought to the [facility] suite. Identity was confirmed. All relevant records and images related to the planned course of therapy were reviewed. The patient freely provided informed written consent to proceed with treatment after reviewing the details related to the planned course of therapy. The consent form was witnessed and verified by the simulation staff. Then, the patient was set-up in a stable reproducible supine position for radiation therapy. Pelvic exam revealed the vaginal cuff to be intact. The patient's custom vaginal cylinder was placed in the proximal vagina. This was affixed to the CT/MR stabilization plate to prevent slippage. Patient tolerated the placement well.  Verification simulation note:  A fiducial marker was placed within the vaginal cylinder. An AP and lateral film was then obtained through the pelvis area. This documented accurate position of the vaginal cylinder for treatment.  HDR BRACHYTHERAPY TREATMENT  The remote afterloading device was affixed to the vaginal cylinder by catheter. Patient then proceeded to undergo her fifth and final high-dose-rate treatment directed at the proximal vagina. The patient was prescribed a dose of 6.0 gray to be delivered to the mucosal surface. Treatment length was 3.0 cm. Patient was treated with 1 channel using 7 dwell positions. Treatment time was 217.5  seconds. Iridium 192 was the high-dose-rate source for treatment. The patient tolerated the treatment well. After completion of her therapy, a radiation survey was performed documenting return of the iridium source into the GammaMed safe.   PLAN: The patient has completed HDR radiation treatment and will return to radiation oncology clinic for routine followup in one month. ________________________________  Blair Promise, PhD, MD   This document serves as a record of services personally performed by Gery Pray, MD. It was created on his behalf by Rae Lips, a trained medical scribe. The creation of this record is based on the scribe's personal observations and the provider's statements to them. This document has been checked and approved by the attending provider.

## 2019-01-02 NOTE — Progress Notes (Incomplete)
Radiation Oncology         (336) [phone] ________________________________  Name: Mariah Mcclure MRN: [id number]  Date: 01/02/2019  DOB: September 09, 1965  End of Treatment Note  Diagnosis:   54 y.o. female with stageIAgrade 3 endometrial adenocarcinoma, p53normalwild typeexpression pattern     Indication for treatment:  Curative       Radiation treatment dates:   12/05/2018, 12/11/2018, 12/18/2018, 12/26/2018, 01/02/2019  Site/dose:   Vaginal cuff / 30 Gy in 5 fractions of 6 Gy  Beams/energy:   HDR Ir-192 Vaginal Brachytherapy  The patient was treated with a2.5cm diameter cylinder with a treatment length of 3.0cm.  Narrative: The patient tolerated radiation treatment relatively well.   The patient did not have any signs of acute toxicity during treatment.  Plan: The patient has completed radiation treatment. The patient will return to radiation oncology clinic for routine followup in one month. I advised them to call or return sooner if they have any questions or concerns related to their recovery or treatment.  -----------------------------------  Blair Promise, PhD, MD  This document serves as a record of services personally performed by Gery Pray, MD. It was created on his behalf by Rae Lips, a trained medical scribe. The creation of this record is based on the scribe's personal observations and the provider's statements to them. This document has been checked and approved by the attending provider.

## 2019-01-06 NOTE — Progress Notes (Signed)
  Patient Name: Mariah Mcclure MRN: [id number] DOB: 1965-12-22 Referring Physician: [name] (Profile Not Attached) Date of Service: 01/02/2019 [facility], [place]                                                        End Of Treatment Note  Diagnoses: endometrial adenocarcinoma, p53normalwild typeexpression pattern  Cancer Staging: stageIAgrade 3  Indication for Treatment: Curative, post-op adjuvant  Radiation Treatment Dates: 12/05/2018, 12/11/2018, 12/18/2018, 12/26/2018, 01/02/2019 Site Technique Total Dose Dose per Fx Completed Fx Beam Energies  Pelvis: Pelvis_V cuff HDR Brachytherapy  30/30 6 5/5 Iridium-192   Narrative: The patient tolerated radiation therapy relatively well.   Plan: The patient will follow-up with radiation oncology in 1 month .  ________________________________________________ -----------------------------------  Blair Promise, PhD, MD This document serves as a record of services personally performed by Gery Pray, MD. It was created on his behalf by Mary-Margaret Loma Messing, a trained medical scribe. The creation of this record is based on the scribe's personal observations and the provider's statements to them. This document has been checked and approved by the attending provider.

## 2019-01-30 ENCOUNTER — Encounter: Payer: Self-pay | Admitting: Radiation Oncology

## 2019-01-30 ENCOUNTER — Other Ambulatory Visit: Payer: Self-pay

## 2019-01-30 ENCOUNTER — Ambulatory Visit
Admission: RE | Admit: 2019-01-30 | Discharge: 2019-01-30 | Disposition: A | Discharge status: Home / Self Care | Payer: Medicare Other | Source: Ambulatory Visit | Attending: Radiation Oncology | Admitting: Radiation Oncology

## 2019-01-30 VITALS — BP 138/96 | HR 81 | Temp 98.4°F | Resp 20 | Ht <= 58 in | Wt 149.8 lb

## 2019-01-30 DIAGNOSIS — R140 Abdominal distension (gaseous): Secondary | ICD-10-CM | POA: Diagnosis not present

## 2019-01-30 DIAGNOSIS — C541 Malignant neoplasm of endometrium: Secondary | ICD-10-CM | POA: Diagnosis not present

## 2019-01-30 DIAGNOSIS — Z79899 Other long term (current) drug therapy: Secondary | ICD-10-CM | POA: Insufficient documentation

## 2019-01-30 DIAGNOSIS — Z7984 Long term (current) use of oral hypoglycemic drugs: Secondary | ICD-10-CM | POA: Insufficient documentation

## 2019-01-30 DIAGNOSIS — Z923 Personal history of irradiation: Secondary | ICD-10-CM | POA: Insufficient documentation

## 2019-01-30 NOTE — Progress Notes (Addendum)
Radiation Oncology         [phone] ________________________________  Name: Mariah Mcclure MRN: [id number]  Date: 01/30/2019  DOB: August 14, 1965  Follow-Up Visit Note  CC: System, Pcp Not In  [name], MD    ICD-10-CM   1. Endometrial cancer [facility])  C54.1     Diagnosis:   stageIAgrade 3 endometrial adenocarcinoma, p53normalwild typeexpression pattern  Interval Since Last Radiation:  1 month 5/7, 5/13, 5/20, 5/28, 01/02/2019: Vaginal Cuff, HDR (Ir-192) / 30 Gy in 5 fractions  Narrative:  The patient returns today for routine follow-up. She is accompanied by her sister.    She reports occasional abdominal bloating. She denies any pain, dysuria or hematuria, vaginal discharge or bleeding, rectal bleeding, diarrhea or constipation, and nausea or vomiting.                            ALLERGIES:  has No Known Allergies.  Meds: Current Outpatient Medications  Medication Sig Dispense Refill  . Accu-Chek Softclix Lancets lancets E 11.9, check blood sugar once daily 100 each 1  . Blood Glucose Monitoring Suppl (ACCU-CHEK AVIVA) device E 11.9 Use as instructed 1 each 0  . carbamazepine (TEGRETOL XR) 400 MG 12 hr tablet Take 400 mg by mouth 2 (two) times daily.     Marland Kitchen glucose blood test strip E11.9, for once daily checks 100 each 1  . ibuprofen (ADVIL,MOTRIN) 800 MG tablet Take 1 tablet (800 mg total) by mouth every 8 (eight) hours as needed for moderate pain. 30 tablet 1  . metFORMIN (GLUCOPHAGE) 500 MG tablet Take 500 mg by mouth 2 (two) times daily with a meal.     . rosuvastatin (CRESTOR) 20 MG tablet Take 20 mg by mouth daily.     No current facility-administered medications for this encounter.     Physical Findings: The patient is in no acute distress. Patient is alert and oriented.  height is '4\' 10"'$  (1.473 m) and weight is 149 lb 12.8 oz (67.9 kg). Her temperature is 98.4 F (36.9 C). Her blood pressure is 138/96 (abnormal) and her pulse is 81. Her respiration is 20 and  oxygen saturation is 100%.  Lungs are clear to auscultation bilaterally. Heart has regular rate and rhythm. No palpable cervical, supraclavicular, or axillary adenopathy. Abdomen soft, non-tender, normal bowel sounds. Pelvic exam deferred in light of recent treatment completion.  Lab Findings: Lab Results  Component Value Date   WBC 6.2 10/08/2018   HGB 12.1 10/08/2018   HCT 37.2 10/08/2018   MCV 93.0 10/08/2018   PLT 298 10/08/2018    Radiographic Findings: No results found.  Impression:    The patient did not experience any significant side effects with her vaginal brachii therapy.  Tolerated treatment quite well.  Plan: Routine follow-up in 5 months.  Patient will be scheduled to see Dr. Denman George in 2 months.  Today the patient was given a vaginal dilator and instructions on its use.  Her sister accompanied her with the visit to ensure patient understood how to use this equipment.  Patient may be moving back to the [place] area later this year where she normally lives but we will keep above follow-up appointments for the time being.  ____________________________________ Gery Pray, MD   This document serves as a record of services personally performed by Gery Pray, MD. It was created on his behalf by Wilburn Mylar, a trained medical scribe. The creation of this record is based on  the scribe's personal observations and the provider's statements to them. This document has been checked and approved by the attending provider.

## 2019-01-30 NOTE — Progress Notes (Signed)
Pt presents today for f/u with Dr. Sondra Come. Pt is accompanied by sister to facilitate vaginal dilator teaching. Pt denies c/o pain. Pt denies dysuria/hematuria. Pt denies vaginal bleeding/discharge. Pt denies rectal bleeding, diarrhea/constipation. Pt reports occasional abdominal bloating. Pt denies N/V.   BP (!) 138/96 (BP Location: Left Arm, Patient Position: Sitting)   Pulse 81   Temp 98.4 F (36.9 C)   Resp 20   Ht 4\' 10"  (1.473 m)   Wt 149 lb 12.8 oz (67.9 kg)   SpO2 100%   BMI 31.31 kg/m   Wt Readings from Last 3 Encounters:  01/30/19 149 lb 12.8 oz (67.9 kg)  12/05/18 148 lb 2 oz (67.2 kg)  11/25/18 146 lb 4 oz (66.3 kg)   Loma Sousa, RN BSN     Home Care Instructions for the Insertion and Care of Your Vaginal Dilator  Why Do I Need a Vaginal Dilator?  Internal radiation therapy may cause scar tissue to form at the top of your vagina (vaginal cuff).  This may make vaginal examinations difficult in the future. You can prevent scar tissue from forming by using a vaginal dilator (a smooth plastic rod), and/or by having regular sexual intercourse.  If not using the dilator you should be having intercourse two or three times a week.  If you are unable to have intercourse, you should use your vaginal dilator.  You may have some spotting or bleeding from your dilator or intercourse the first few times. You may also have some discomfort. If discomfort occurs with intercourse, you and your partner may need to stop for a while and try again later.  How to Use Your Vaginal Dilator  - Wash the dilator with soap and water before and after each use. - Check the dilator to be sure it is smooth. Do not use the dilator if you find any roughspots. - Coat the dilator with K-Y Jelly, Astroglide, or Replens. Do not use Vaseline, baby oil, or other oil based lubricants. They are not water-soluble and can be irritating to the tissues in the vagina. - Lie on your back with your  knees bent and legs apart. - Insert the rounded end of the dilator into your vagina as far as it will go without causing pain or discomfort. - Close your knees and slowly straighten your legs. - Keep the dilator in your vagina for about 10 to 15 minutes.  Please use 3 times a week, for example: Monday, Wednesday and Friday evenings. [facility] your knees, open your legs, and gently remove the dilator. - Gently cleanse the skin around the vaginal opening. - Wash the dilator after each use. -  It is important that you use the dilator routinely until instructed otherwise by your doctor.   Loma Sousa, RN BSN

## 2019-01-30 NOTE — Patient Instructions (Signed)
Home Care Instructions for the Insertion and Care of Your Vaginal Dilator  Why Do I Need a Vaginal Dilator?  Internal radiation therapy may cause scar tissue to form at the top of your vagina (vaginal cuff).  This may make vaginal examinations difficult in the future. You can prevent scar tissue from forming by using a vaginal dilator (a smooth plastic rod), and/or by having regular sexual intercourse.  If not using the dilator you should be having intercourse two or three times a week.  If you are unable to have intercourse, you should use your vaginal dilator.  You may have some spotting or bleeding from your dilator or intercourse the first few times. You may also have some discomfort. If discomfort occurs with intercourse, you and your partner may need to stop for a while and try again later.  How to Use Your Vaginal Dilator  - Wash the dilator with soap and water before and after each use. - Check the dilator to be sure it is smooth. Do not use the dilator if you find any roughspots. - Coat the dilator with K-Y Jelly, Astroglide, or Replens. Do not use Vaseline, baby oil, or other oil based lubricants. They are not water-soluble and can be irritating to the tissues in the vagina. - Lie on your back with your knees bent and legs apart. - Insert the rounded end of the dilator into your vagina as far as it will go without causing pain or discomfort. - Close your knees and slowly straighten your legs. - Keep the dilator in your vagina for about 10 to 15 minutes.  Please use 3 times a week, for example: Monday, Wednesday and Friday evenings. - Bend your knees, open your legs, and gently remove the dilator. - Gently cleanse the skin around the vaginal opening. - Wash the dilator after each use. -  It is important that you use the dilator routinely until instructed otherwise by your doctor.   Coronavirus (COVID-19) Are you at risk?  Are you at risk for the Coronavirus  (COVID-19)?  To be considered HIGH RISK for Coronavirus (COVID-19), you have to meet the following criteria:  . Traveled to [place], [place], [place], [place] or [place]; or in the [place] to [place], [place], [place], or [place]; and have fever, cough, and shortness of breath within the last 2 weeks of travel OR . Been in close contact with a person diagnosed with COVID-19 within the last 2 weeks and have fever, cough, and shortness of breath . IF YOU DO NOT MEET THESE CRITERIA, YOU ARE CONSIDERED LOW RISK FOR COVID-19.  What to do if you are HIGH RISK for COVID-19?  . If you are having a medical emergency, call 911. . Seek medical care right away. Before you go to a doctor's office, urgent care or emergency department, call ahead and tell them about your recent travel, contact with someone diagnosed with COVID-19, and your symptoms. You should receive instructions from your physician's office regarding next steps of care.  . When you arrive at healthcare provider, tell the healthcare staff immediately you have returned from visiting [place], [place], [place], [place] or [place]; or traveled in the [place] to [place], [place], [place], or [place]; in the last two weeks or you have been in close contact with a person diagnosed with COVID-19 in the last 2 weeks.   . Tell the health care staff about your symptoms: fever, cough and shortness of breath. .   After you have been seen by a medical provider, you will be either: o Tested for (COVID-19) and discharged home on quarantine except to seek medical care if symptoms worsen, and asked to  - Stay home and avoid contact with others until you get your results (4-5 days)  - Avoid travel on public transportation if possible (such as bus, train, or airplane) or o Sent to the Emergency Department by EMS for evaluation, COVID-19 testing, and possible admission depending on your condition and test results.  What to do if you are LOW  RISK for COVID-19?  Reduce your risk of any infection by using the same precautions used for avoiding the common cold or flu:  . Wash your hands often with soap and warm water for at least 20 seconds.  If soap and water are not readily available, use an alcohol-based hand sanitizer with at least 60% alcohol.  . If coughing or sneezing, cover your mouth and nose by coughing or sneezing into the elbow areas of your shirt or coat, into a tissue or into your sleeve (not your hands). . Avoid shaking hands with others and consider head nods or verbal greetings only. . Avoid touching your eyes, nose, or mouth with unwashed hands.  . Avoid close contact with people who are sick. . Avoid places or events with large numbers of people in one location, like concerts or sporting events. . Carefully consider travel plans you have or are making. . If you are planning any travel outside or inside the [place], visit the CDC's Travelers' Health webpage for the latest health notices. . If you have some symptoms but not all symptoms, continue to monitor at home and seek medical attention if your symptoms worsen. . If you are having a medical emergency, call 911.   ADDITIONAL HEALTHCARE OPTIONS FOR PATIENTS  [facility] / e-Visit: [email or web address]         [facility]: [phone]  [facility]: [phone]                   [facility]: [phone]   

## 2019-03-06 ENCOUNTER — Telehealth: Payer: Self-pay | Admitting: Oncology

## 2019-03-06 NOTE — Telephone Encounter (Signed)
[facility] and faxed records to [phone] so patient can establish care in [place].

## 2019-04-02 ENCOUNTER — Inpatient Hospital Stay: Payer: Medicare Other | Attending: Gynecologic Oncology | Admitting: Gynecologic Oncology

## 2019-04-02 ENCOUNTER — Other Ambulatory Visit (HOSPITAL_COMMUNITY)
Admission: RE | Admit: 2019-04-02 | Discharge: 2019-04-02 | Disposition: A | Discharge status: Home / Self Care | Payer: Medicare Other | Source: Ambulatory Visit | Attending: Gynecologic Oncology | Admitting: Gynecologic Oncology

## 2019-04-02 ENCOUNTER — Other Ambulatory Visit: Payer: Self-pay

## 2019-04-02 ENCOUNTER — Encounter: Payer: Self-pay | Admitting: Gynecologic Oncology

## 2019-04-02 VITALS — BP 145/88 | HR 86 | Temp 98.5°F | Resp 18 | Ht <= 58 in | Wt 156.1 lb

## 2019-04-02 DIAGNOSIS — Z1509 Genetic susceptibility to other malignant neoplasm: Secondary | ICD-10-CM | POA: Insufficient documentation

## 2019-04-02 DIAGNOSIS — R6250 Unspecified lack of expected normal physiological development in childhood: Secondary | ICD-10-CM | POA: Insufficient documentation

## 2019-04-02 DIAGNOSIS — C541 Malignant neoplasm of endometrium: Secondary | ICD-10-CM | POA: Insufficient documentation

## 2019-04-02 DIAGNOSIS — R011 Cardiac murmur, unspecified: Secondary | ICD-10-CM | POA: Insufficient documentation

## 2019-04-02 DIAGNOSIS — Z7984 Long term (current) use of oral hypoglycemic drugs: Secondary | ICD-10-CM | POA: Insufficient documentation

## 2019-04-02 DIAGNOSIS — G40409 Other generalized epilepsy and epileptic syndromes, not intractable, without status epilepticus: Secondary | ICD-10-CM | POA: Diagnosis not present

## 2019-04-02 DIAGNOSIS — D649 Anemia, unspecified: Secondary | ICD-10-CM | POA: Diagnosis not present

## 2019-04-02 DIAGNOSIS — Z79899 Other long term (current) drug therapy: Secondary | ICD-10-CM | POA: Diagnosis not present

## 2019-04-02 DIAGNOSIS — Z90710 Acquired absence of both cervix and uterus: Secondary | ICD-10-CM | POA: Diagnosis not present

## 2019-04-02 DIAGNOSIS — Z148 Genetic carrier of other disease: Secondary | ICD-10-CM | POA: Diagnosis not present

## 2019-04-02 DIAGNOSIS — E119 Type 2 diabetes mellitus without complications: Secondary | ICD-10-CM | POA: Insufficient documentation

## 2019-04-02 DIAGNOSIS — Z90722 Acquired absence of ovaries, bilateral: Secondary | ICD-10-CM | POA: Diagnosis not present

## 2019-04-02 DIAGNOSIS — Z923 Personal history of irradiation: Secondary | ICD-10-CM | POA: Insufficient documentation

## 2019-04-02 NOTE — Progress Notes (Signed)
Follow-up Note: Gyn-Onc  Consult was initially requested by Dr. Garwin Brothers for the evaluation of Mariah Mcclure 53 y.o. female  CC:  Chief Complaint  Patient presents with  . endometrial cancer    follow-up    Assessment/Plan:  [name]  is a 53 y.o.  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  High/intermediate risk factors for recurrence, s/p vaginal brachytherapy completed June, 2020. I recommend she follow-up at 3 monthly intervals for symptom review, physical examination and pelvic examination. Pap smear is not recommended in routine endometrial cancer surveillance. After 2 years we will space these visits to every 6 months, and then annually if recurrence has not developed within 5 years. All questions were answered.  She is interested in returning to [place] for follow-up after she has completed her care and initial follow-up. We will help her find somebody to transition care with.  She would be a candidate for a PDL1 inhibitor if she develops recurrent disease. We have requested HER2 testing on her specimen, as, if over expressed, she would be a candidate for trastuzumab in the case of recurrenc.  HPI: [name] is a 53 year old P0 who is seen in consultation at the request of Dr. cousins for a grade 3 endometrial cancer.  The patient has possibly transition through menopause approximately 2 years ago, although is unclear if she is postmenopausal or not as she is always had a history of abnormal uterine bleeding.  The patient has MR and developmental delay delay and her primary caregivers are her sister in [place] and her brother in [place].  Her sister reports that she developed left lower quadrant pain around Thanksgiving of 2019.  In December 2019 she began reporting heavy vaginal bleeding which became so persistent in January 2020 that she became anemic and  required 2 unit packed cell blood transfusion.  She was then taken to see Dr. cousins for further evaluation and Dr. cousins performed a transvaginal ultrasound scan on August 20, 2018 which revealed a uterus measuring 8.5 x 4.8 x 5.3 cm with an endometrial thickness of 25 mm.  This was followed up by a sonohysterogram which revealed endometrial wall thickening within the fundal portion measuring 3 cm with hypervascularity.  She then underwent a D&C in the operating room on September 16, 2018 this was a diagnostic hysteroscopy, hysteroscopic resection of the endometrial mass and D&C.  The procedure was uncomplicated.  The final pathology revealed a high-grade endometrial carcinoma, with a differential diagnosis including high-grade endometrioid and a mixed endometrioid serous carcinoma.  P53 stain was normal.  P 16 did not show significant overexpression.  The patient has some intellectual impairment from developmental delay.  She is has a seizure disorder though this included on a singular grand mal seizure approximately 30 years ago.  She takes Tegretol for this.  She has type 2 diabetes mellitus which is controlled with metformin.  Her only prior abdominal surgery was a laparoscopic cholecystectomy.  Her family history is remarkable for a maternal aunt who has a history of endometrial cancer a maternal aunt with breast cancer maternal aunt with pancreatic cancer and a maternal uncle with lung cancer who was a non-smoker but possibly had environmental exposure to coal.  Her father had a diagnosis of prostate cancer.  Interval Hx:  On 10/10/18 she underwent a robotic assisted total hysterectomy, BSO and SLN biopsy. Final pathology revealed a FIGO grade  3 endometrioid adenocarcinoma, p53 wild-type.  It was minimally invasive into the endometrium invading 3 of 21 mm thickness.  LVSI was present.  6 sentinel lymph nodes were all negative for metastatic carcinoma as were the adnexa and cervix.  Final staging  was a stage Ia grade 3 endometrioid adenocarcinoma. She was determined to have high/intermediate risk factors for recurrence and therefore vaginal brachytherapy was recommended in accordance with NCCN guidelines.   Genetic testing revealed that she carried a germline mutation in MSH6 and a diagnosis of Lynch syndrome was made.  She completed vaginal brachytherapy (total of 30Gy in 5 fractions x 6Gy) in June, 2020.   Current Meds:  Outpatient Encounter Medications as of 04/02/2019  Medication Sig  . Accu-Chek Softclix Lancets lancets E 11.9, check blood sugar once daily  . Blood Glucose Monitoring Suppl (ACCU-CHEK AVIVA) device E 11.9 Use as instructed  . carbamazepine (TEGRETOL XR) 400 MG 12 hr tablet Take 400 mg by mouth 2 (two) times daily.   Marland Kitchen glucose blood test strip E11.9, for once daily checks  . ibuprofen (ADVIL,MOTRIN) 800 MG tablet Take 1 tablet (800 mg total) by mouth every 8 (eight) hours as needed for moderate pain.  . metFORMIN (GLUCOPHAGE) 500 MG tablet Take 500 mg by mouth 2 (two) times daily with a meal.   . rosuvastatin (CRESTOR) 20 MG tablet Take 20 mg by mouth daily.   No facility-administered encounter medications on file as of 04/02/2019.     Allergy: No Known Allergies  Social Hx:   Social History   Socioeconomic History  . Marital status: Single    Spouse name: Not on file  . Number of children: Not on file  . Years of education: Not on file  . Highest education level: Not on file  Occupational History  . Not on file  Social Needs  . Financial resource strain: Not on file  . Food insecurity    Worry: Not on file    Inability: Not on file  . Transportation needs    Medical: Not on file    Non-medical: Not on file  Tobacco Use  . Smoking status: Never Smoker  . Smokeless tobacco: Never Used  Substance and Sexual Activity  . Alcohol use: Never    Frequency: Never  . Drug use: Never  . Sexual activity: Not on file  Lifestyle  . Physical activity     Days per week: Not on file    Minutes per session: Not on file  . Stress: Not on file  Relationships  . Social Herbalist on phone: Not on file    Gets together: Not on file    Attends religious service: Not on file    Active member of club or organization: Not on file    Attends meetings of clubs or organizations: Not on file    Relationship status: Not on file  . Intimate partner violence    Fear of current or ex partner: Not on file    Emotionally abused: Not on file    Physically abused: Not on file    Forced sexual activity: Not on file  Other Topics Concern  . Not on file  Social History Narrative  . Not on file    Past Surgical Hx:  Past Surgical History:  Procedure Laterality Date  . CHOLECYSTECTOMY  20 yrs ago  . DILATATION & CURETTAGE/HYSTEROSCOPY WITH MYOSURE N/A 09/16/2018   Procedure: DILATATION & CURETTAGE/HYSTEROSCOPY WITH MYOSURE;  Surgeon: Servando Salina,  MD;  Location: [facility];  Service: Gynecology;  Laterality: N/A;  . EYE SURGERY     Patient was 53 years old.  Marland Kitchen LYMPH NODE BIOPSY N/A 10/10/2018   Procedure: SENTINEL  LYMPH NODE BIOPSY;  Surgeon: [name], MD;  Location: [facility];  Service: Gynecology;  Laterality: N/A;  . ROBOTIC ASSISTED TOTAL HYSTERECTOMY WITH BILATERAL SALPINGO OOPHERECTOMY Bilateral 10/10/2018   Procedure: XI ROBOTIC ASSISTED TOTAL HYSTERECTOMY WITH BILATERAL SALPINGO OOPHORECTOMY;  Surgeon: [name], MD;  Location: [facility];  Service: Gynecology;  Laterality: Bilateral;    Past Medical Hx:  Past Medical History:  Diagnosis Date  . Abnormal perimenopausal bleeding   . Anemia    due to bleeding  . Developmental delay   . Diabetes mellitus without complication ([place])    type 2  . Family history of breast cancer   . Family history of pancreatic cancer   . Heart murmur   . Hepatitis 1990's    Patient states that she has hep A yrs ago took tx  . Seizure ([place]) 30 yrs ago    Past Gynecological  History:  G0 No LMP recorded.  Family Hx:  Family History  Problem Relation Age of Onset  . Stroke Mother   . Prostate cancer Father   . Breast cancer Maternal Aunt 70  . Lung cancer Maternal Uncle   . Endometrial cancer Maternal Aunt   . Stroke Maternal Aunt   . Pancreatic cancer Maternal Aunt   . Leukemia Maternal Aunt     Review of Systems:  Constitutional  Feels well,    ENT Normal appearing ears and nares bilaterally Skin/Breast  No rash, sores, jaundice, itching, dryness Cardiovascular  No chest pain, shortness of breath, or edema  Pulmonary  No cough or wheeze.  Gastro Intestinal  No nausea, vomitting, or diarrhoea. No bright red blood per rectum, no abdominal pain, change in bowel movement, or constipation.  Genito Urinary  No frequency, urgency, dysuria, no bleeding Musculo Skeletal  No myalgia, arthralgia, joint swelling or pain  Neurologic  No weakness, numbness, change in gait,  Psychology  No depression, anxiety, insomnia.   Vitals:  Blood pressure (!) 145/88, pulse 86, temperature 98.5 F (36.9 C), temperature source Oral, resp. rate 18, height _0  (1.473 m), weight 156 lb 1.6 oz (70.8 kg), SpO2 100 %.  Physical Exam: WD in NAD Neck  Supple NROM, without any enlargements.  Lymph Node Survey No cervical supraclavicular or inguinal adenopathy Cardiovascular  Pulse normal rate, regularity and rhythm. S1 and S2 normal.  Lungs  Clear to auscultation bilateraly, without wheezes/crackles/rhonchi. Good air movement.  Skin  No rash/lesions/breakdown  Psychiatry  Alert and oriented to person, place, and time  Abdomen  Normoactive bowel sounds, abdomen soft, non-tender and nonobese without evidence of hernia. Incisions soft.  Back No CVA tenderness Genito Urinary  Vaginal cuff smooth, free of blood, masses or lesions.  Rectal  deferred Extremities  No bilateral cyanosis, clubbing or edema.  Thereasa Solo, MD  04/02/2019, 9:13 AM

## 2019-04-02 NOTE — Patient Instructions (Signed)
Due to your history of a uterine cancer, you require 3 monthly follow-up with a gynecologic oncologist for 2 years, and then 6 monthly checks after that time.  Dr Denman George recommends Dr Philip Aspen ph [phone]  Due to your diagnosis of Lynch syndrome, you should follow-up with a gastroenterologist specialist soon in [place] because you are at a high risk of developing colon cancer and this needs to be screened.

## 2019-04-10 ENCOUNTER — Encounter: Payer: Self-pay | Admitting: Oncology

## 2019-04-10 NOTE — Progress Notes (Signed)
Faxed pathology results to [facility] at [phone].

## 2019-04-18 ENCOUNTER — Telehealth: Payer: Self-pay

## 2019-04-18 NOTE — Telephone Encounter (Signed)
Faxed  Dr. Serita Grit and Dr. Clabe Seal records as well as genetic information and surgical pathology to [facility] in [place]. Per [facility].

## 2019-04-21 ENCOUNTER — Telehealth: Payer: Self-pay | Admitting: Oncology

## 2019-04-21 NOTE — Telephone Encounter (Signed)
Called Dr. Doy Mince' office in [place] and verified that they have received the records faxed on 04/18/2019.  Called Greta and advised her that Dr. Doy Mince' office has received the records.

## 2019-04-24 ENCOUNTER — Telehealth: Payer: Self-pay | Admitting: Oncology

## 2019-04-24 NOTE — Telephone Encounter (Signed)
Faxed MSI results to Dr. Doy Mince at [phone].

## 2019-06-04 ENCOUNTER — Telehealth: Payer: Self-pay | Admitting: *Deleted

## 2019-06-04 NOTE — Telephone Encounter (Signed)
CALLED PATIENT TO ALTER FU ON 07-03-19 DUE TO DR. KINARD BEING IN THE OR, SPOKE WITH PATIENT'S SISTER -GRETA  AND SHE STATED THAT Mizuki HAS MOVED BACK TO [place] AND SHE IS RECEIVING FU CARE THERE, THEREFORE HER FU WITH DR. KINARD ON 07-03-19 HAS BEEN CANCELLED, PATIENT'S RELATIVE GRETA VERIFIED UNDERSTANDING THIS

## 2019-07-03 ENCOUNTER — Ambulatory Visit: Payer: Self-pay | Admitting: Radiation Oncology

## 2019-07-22 ENCOUNTER — Other Ambulatory Visit: Payer: Self-pay | Admitting: Gynecologic Oncology

## 2019-07-22 DIAGNOSIS — E119 Type 2 diabetes mellitus without complications: Secondary | ICD-10-CM

## 2019-07-22 NOTE — Telephone Encounter (Signed)
Refills need to come from primary care

## 2020-05-28 IMAGING — CT CT ABDOMEN AND PELVIS WITH CONTRAST
2 of 5 series · 16 of 46 positions shown, 18 images · IV contrast (omnipaque)
Comparison: None.

CLINICAL DATA: New diagnosis of high-grade endometrial cancer.
Staging evaluation.

EXAM:
CT ABDOMEN AND PELVIS WITH CONTRAST
TECHNIQUE: Multidetector CT imaging of the abdomen and pelvis was performed
using the standard protocol following bolus administration of
intravenous contrast.
CONTRAST:  100mL OMNIPAQUE IOHEXOL 300 MG/ML  SOLN

[Series 2: axial st · axial · 0.74mm/px · z∈[+160,+540]mm · 13 of 90 slices shown, 15 images]
[im 7/90  soft-tissue]
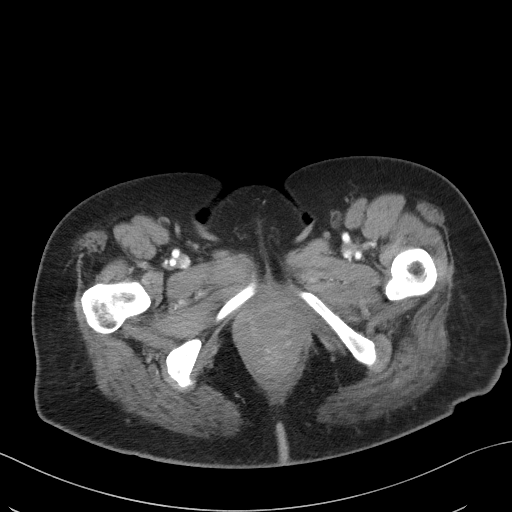
[im 7/90  bone]
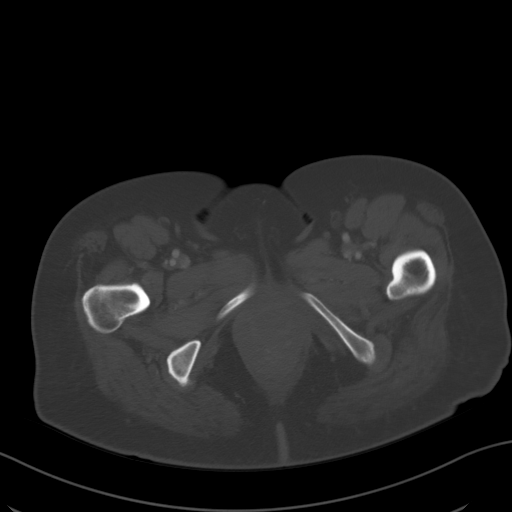
[im 13/90  soft-tissue]
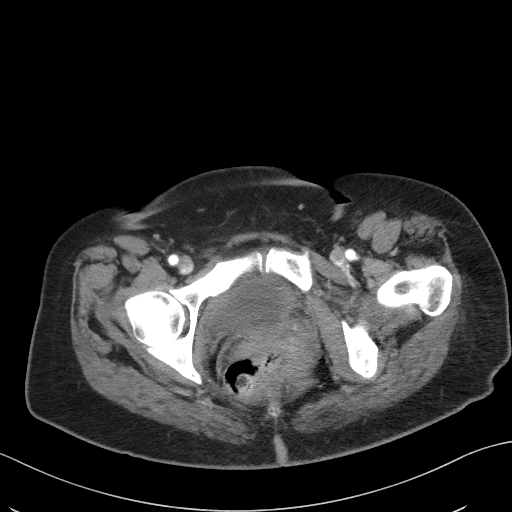
[im 20/90  soft-tissue]
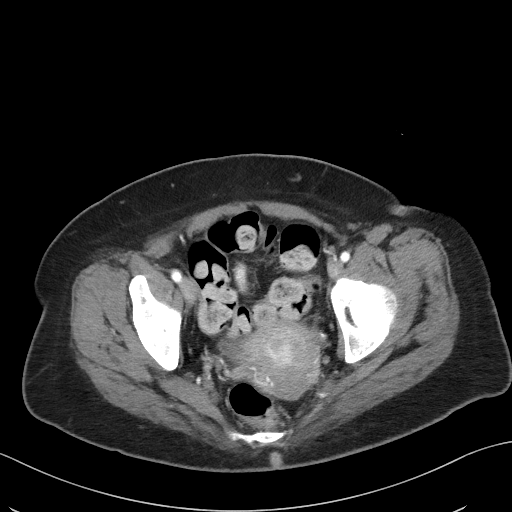
[im 26/90  soft-tissue]
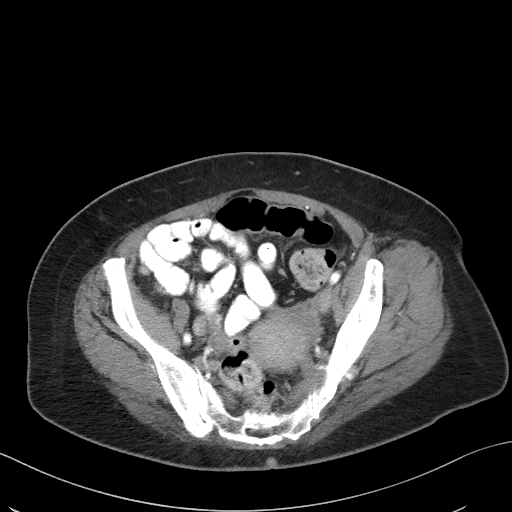
[im 32/90  soft-tissue]
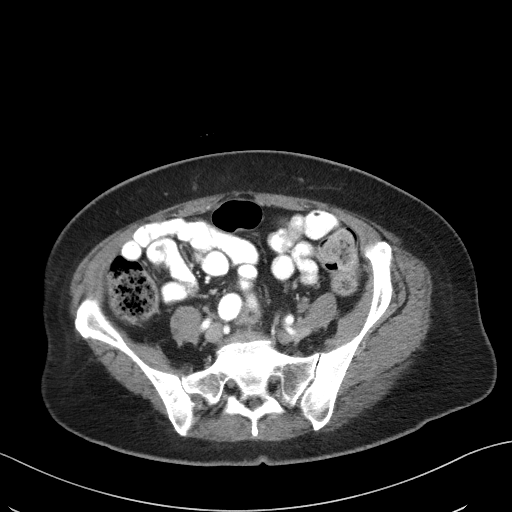
[im 39/90  soft-tissue]
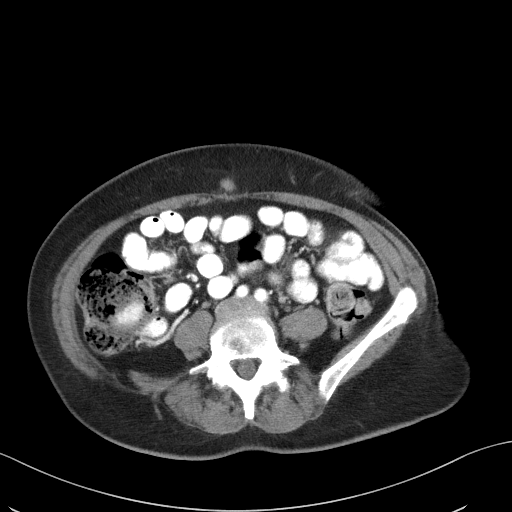
[im 45/90  soft-tissue]
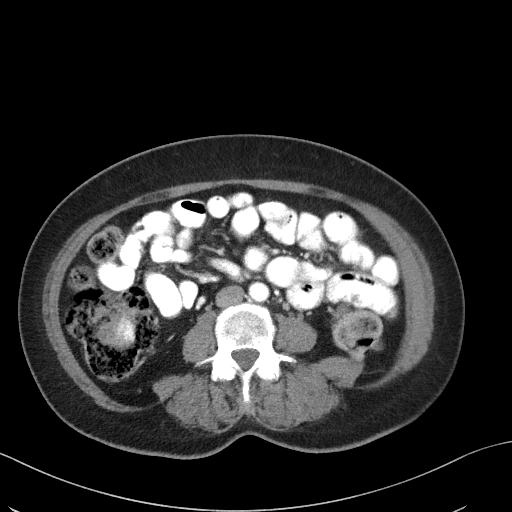
[im 51/90  soft-tissue]
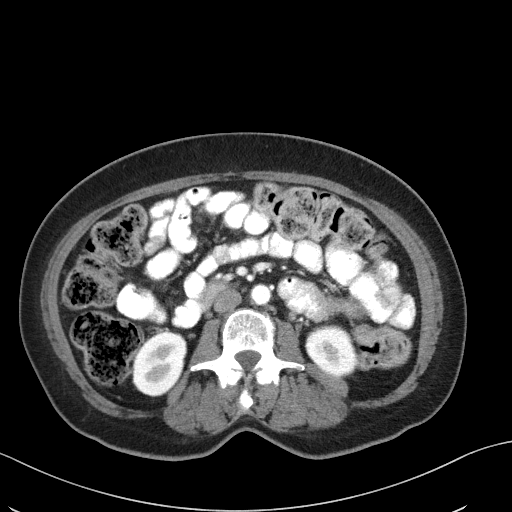
[im 58/90  soft-tissue]
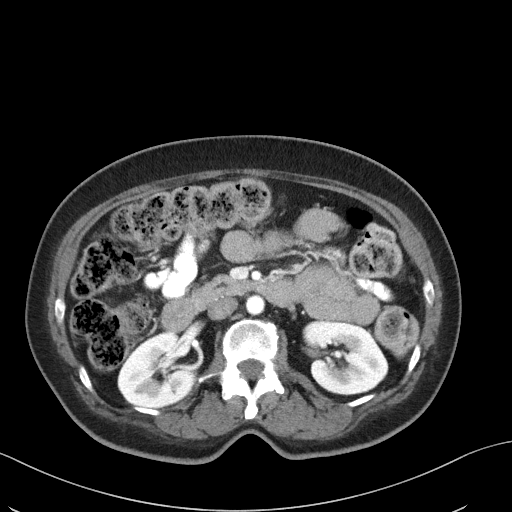
[im 58/90  bone]
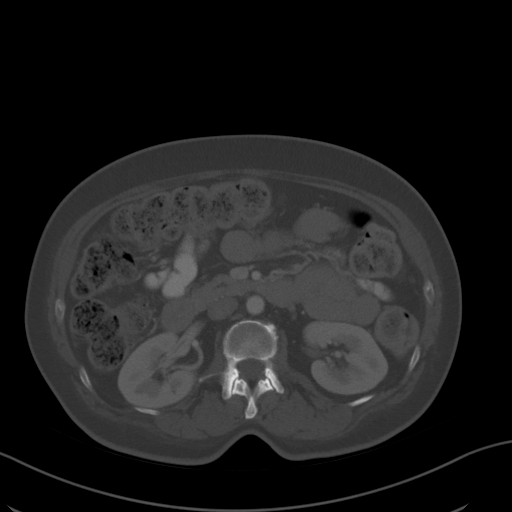
[im 64/90  soft-tissue]
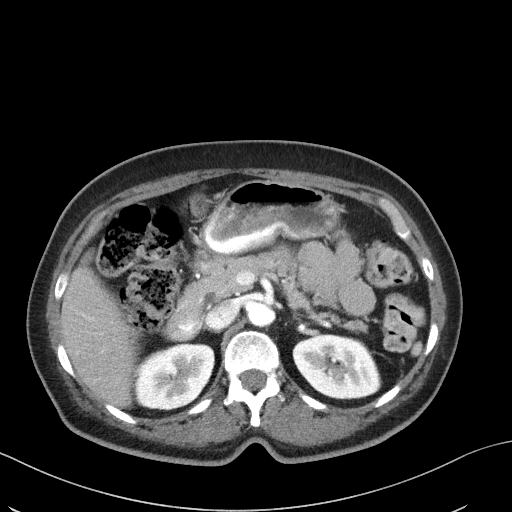
[im 70/90  soft-tissue]
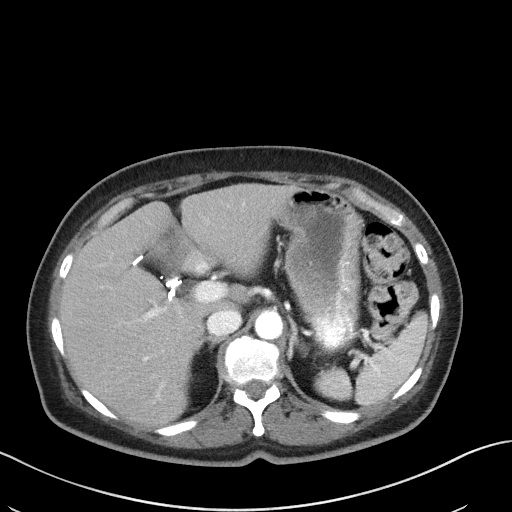
[im 77/90  soft-tissue]
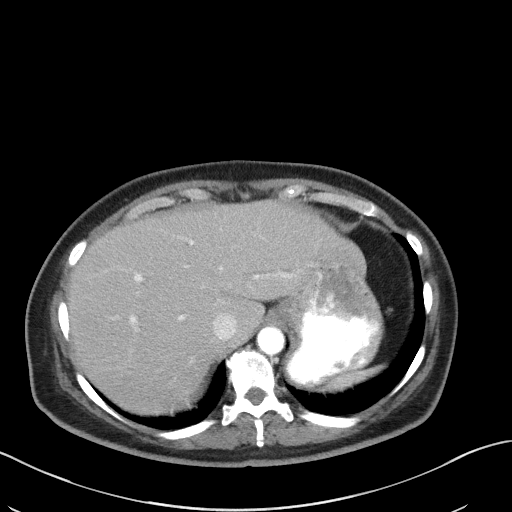
[im 83/90  soft-tissue]
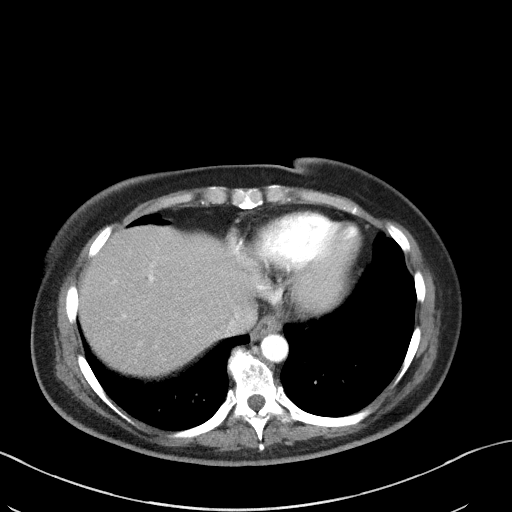

[Series 6: coronal st · coronal · 0.68mm/px · 3 of 81 slices shown]
[im 27/81  soft-tissue]
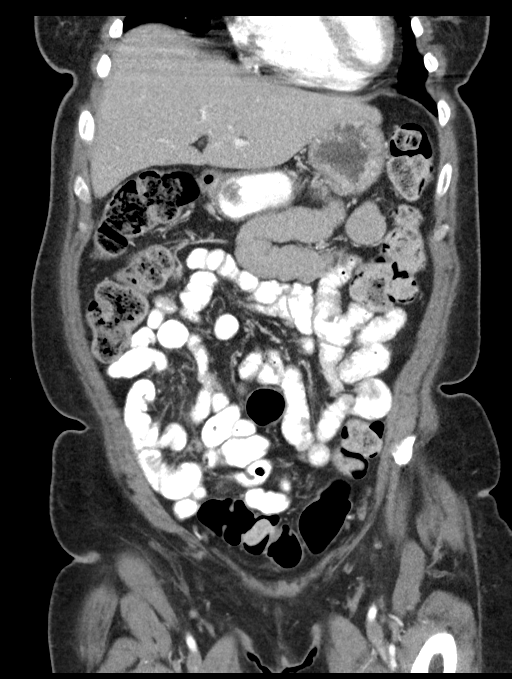
[im 36/81  soft-tissue]
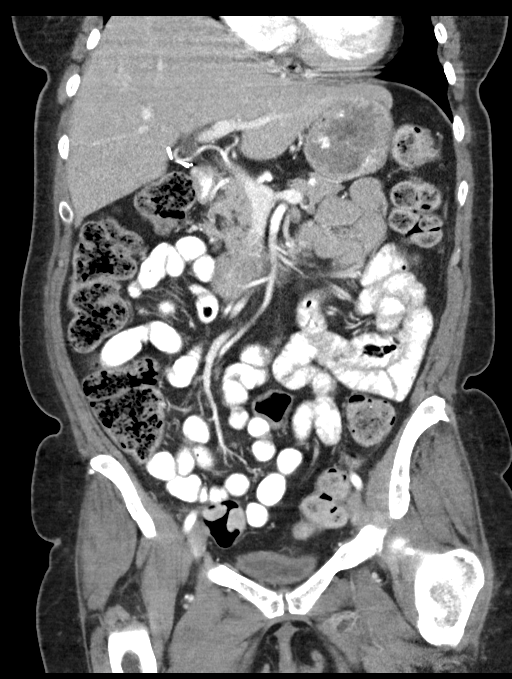
[im 45/81  soft-tissue]
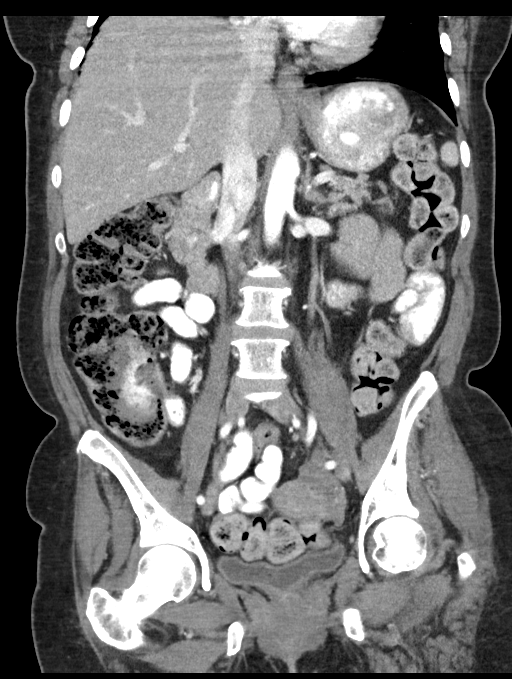

[16 of 46 positions shown; findings below may reference images not displayed]

FINDINGS: Lower chest: No significant pulmonary nodules or acute consolidative
airspace disease.

Hepatobiliary: Normal liver size. No liver mass. Cholecystectomy. No
biliary ductal dilatation.

Pancreas: Normal, with no mass or duct dilation.

Spleen: Normal size. No mass.

Adrenals/Urinary Tract: Normal adrenals. Normal kidneys with no
hydronephrosis and no renal mass. Normal bladder.

Stomach/Bowel: Normal non-distended stomach. Normal caliber small
bowel with no small bowel wall thickening. Normal appendix. Normal
large bowel with no diverticulosis, large bowel wall thickening or
pericolonic fat stranding.

Vascular/Lymphatic: Normal caliber abdominal aorta. Patent portal,
splenic, hepatic and renal veins. No pathologically enlarged lymph
nodes in the abdomen or pelvis.

Reproductive: Mildly enlarged heterogeneous anteverted uterus
without discrete mass. No adnexal masses.

Other: No pneumoperitoneum, ascites or focal fluid collection.

Musculoskeletal: No aggressive appearing focal osseous lesions. Mild
thoracolumbar spondylosis.
IMPRESSION: 1. No lymphadenopathy or other findings of metastatic disease in the
abdomen or pelvis.
2. Nonspecific mildly enlarged anteverted heterogeneous uterus
without discrete uterine mass by CT. No adnexal masses.
